# Patient Record
Sex: Male | Born: 1985 | Race: White | Hispanic: No | Marital: Single | Smoking: Never smoker
Health system: Southern US, Community
[De-identification: ages and names within clinical notes are randomized; demographics above are authoritative.]

## PROBLEM LIST (undated history)

## (undated) DIAGNOSIS — E785 Hyperlipidemia, unspecified: Secondary | ICD-10-CM

## (undated) DIAGNOSIS — Z9289 Personal history of other medical treatment: Secondary | ICD-10-CM

## (undated) DIAGNOSIS — S3609XA Other injury of spleen, initial encounter: Secondary | ICD-10-CM

## (undated) DIAGNOSIS — E039 Hypothyroidism, unspecified: Secondary | ICD-10-CM

## (undated) HISTORY — DX: Other injury of spleen, initial encounter: S36.09XA

## (undated) HISTORY — DX: Hypothyroidism, unspecified: E03.9

## (undated) HISTORY — DX: Hyperlipidemia, unspecified: E78.5

## (undated) HISTORY — DX: Personal history of other medical treatment: Z92.89

---
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
## 2005-01-13 ENCOUNTER — Ambulatory Visit: Payer: Self-pay | Admitting: Family Medicine

## 2005-03-24 ENCOUNTER — Ambulatory Visit: Payer: Self-pay | Admitting: Family Medicine

## 2005-06-22 ENCOUNTER — Ambulatory Visit: Payer: Self-pay | Admitting: Family Medicine

## 2005-06-27 ENCOUNTER — Ambulatory Visit: Payer: Self-pay | Admitting: Family Medicine

## 2005-08-03 ENCOUNTER — Ambulatory Visit: Payer: Self-pay | Admitting: Family Medicine

## 2005-09-04 ENCOUNTER — Ambulatory Visit: Payer: Self-pay | Admitting: Family Medicine

## 2005-09-25 ENCOUNTER — Ambulatory Visit: Payer: Self-pay | Admitting: Family Medicine

## 2005-09-27 ENCOUNTER — Ambulatory Visit: Payer: Self-pay | Admitting: Family Medicine

## 2006-01-10 IMAGING — CT CT ABDOMEN W/ CM
1 of 3 series · 15 of 32 positions shown, 19 images · non-contrast
Comparison: none

REASON FOR EXAM: Ruptured Spleen February 2004  Follow Up Status
COMMENTS:

[Series 4: inspace · axial · 0.66mm/px · z∈[-429,-199]mm · 15 of 324 slices shown, 19 images]
[im 24/324  soft-tissue]
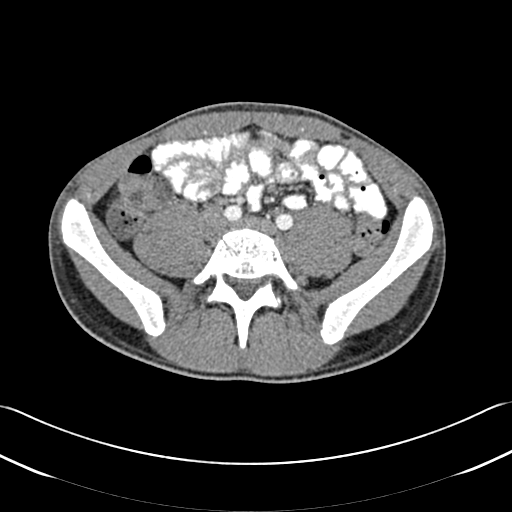
[im 24/324  bone]
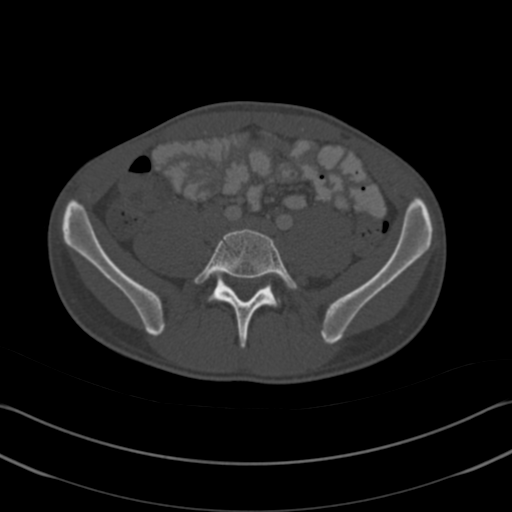
[im 48/324  soft-tissue]
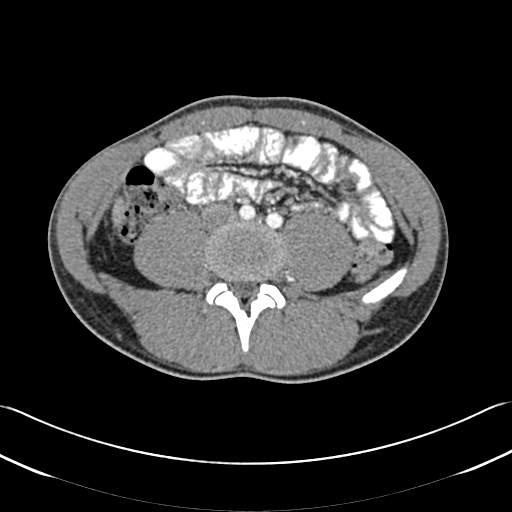
[im 72/324  soft-tissue]
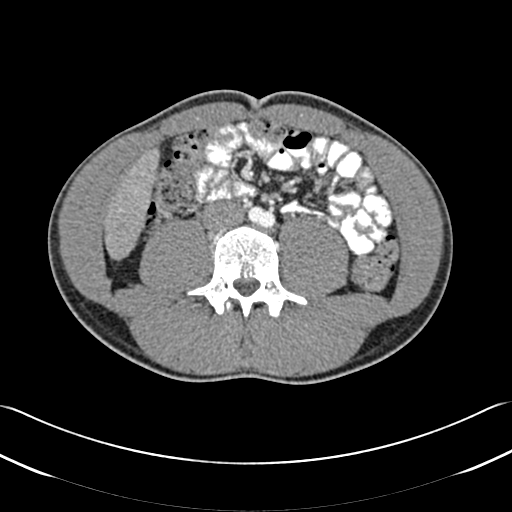
[im 96/324  soft-tissue]
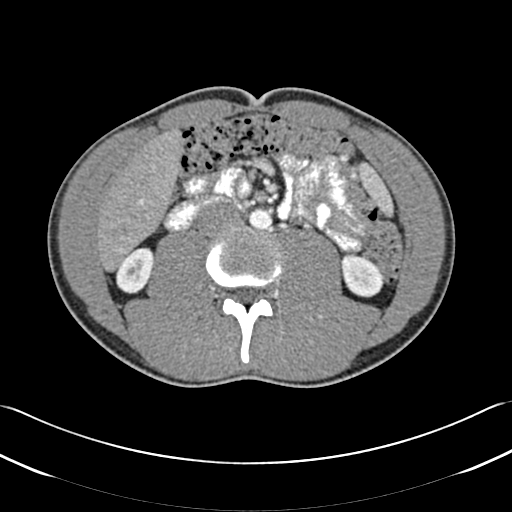
[im 120/324  soft-tissue]
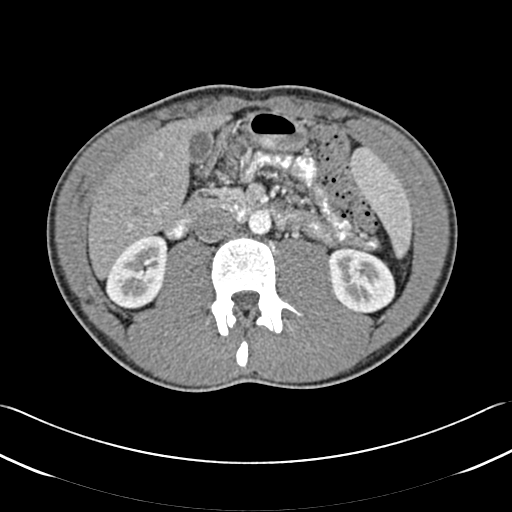
[im 144/324  soft-tissue]
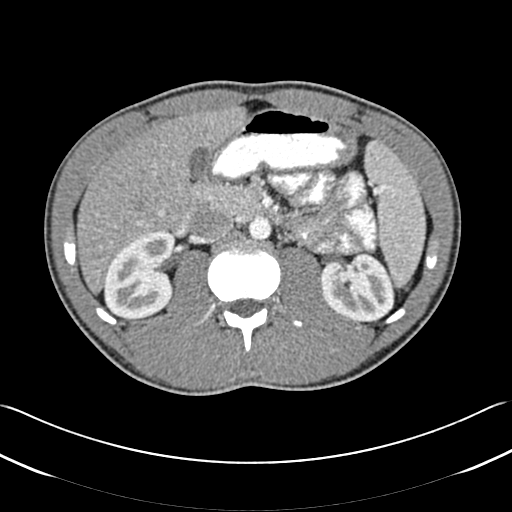
[im 168/324  soft-tissue]
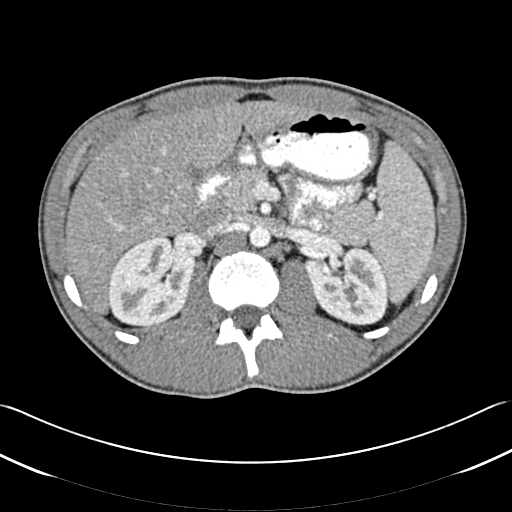
[im 192/324  soft-tissue]
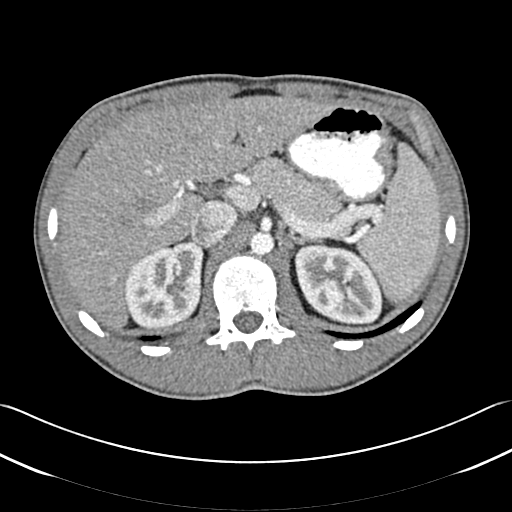
[im 216/324  soft-tissue]
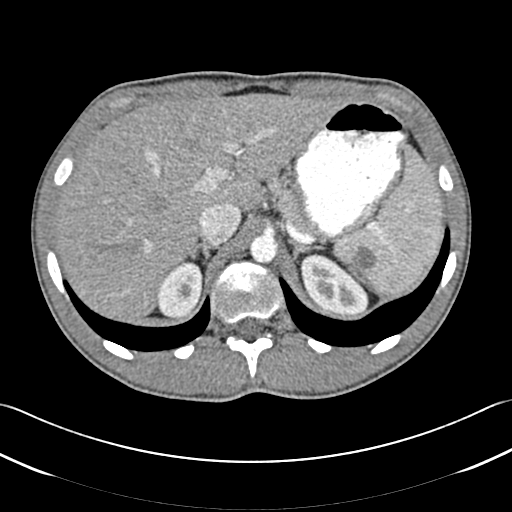
[im 216/324  bone]
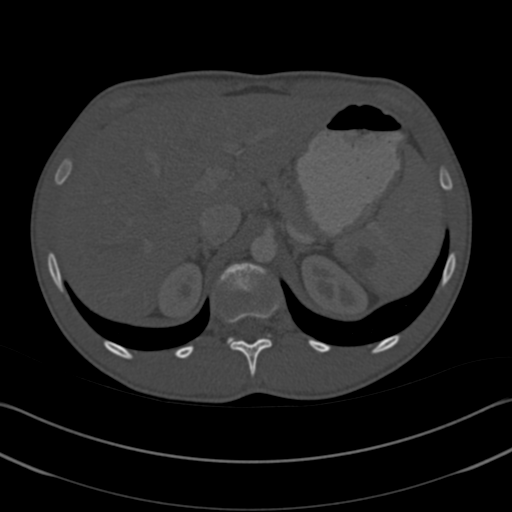
[im 240/324  soft-tissue]
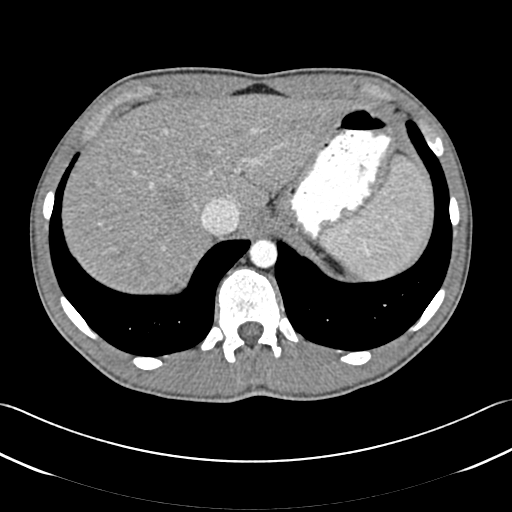
[im 264/324  soft-tissue]
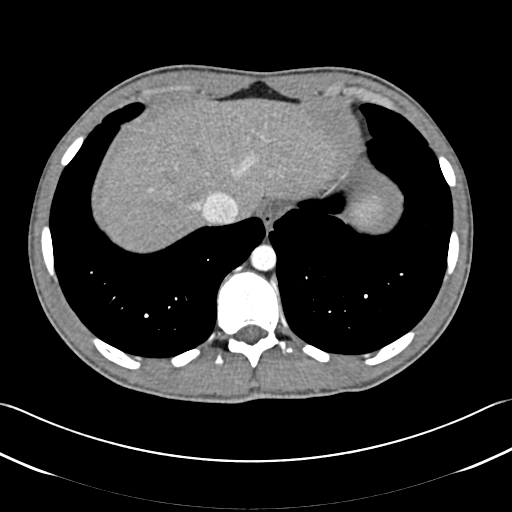
[im 276/324  lung]
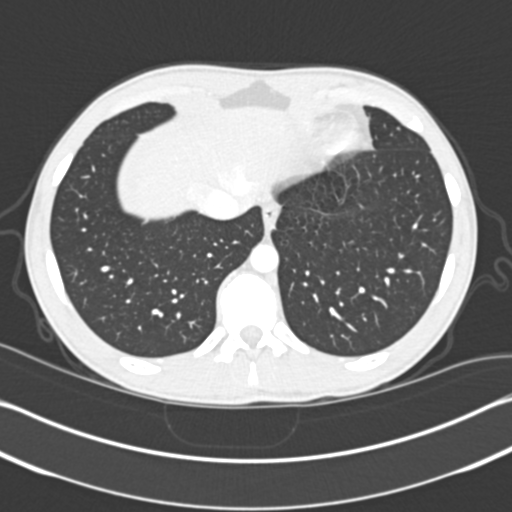
[im 288/324  soft-tissue]
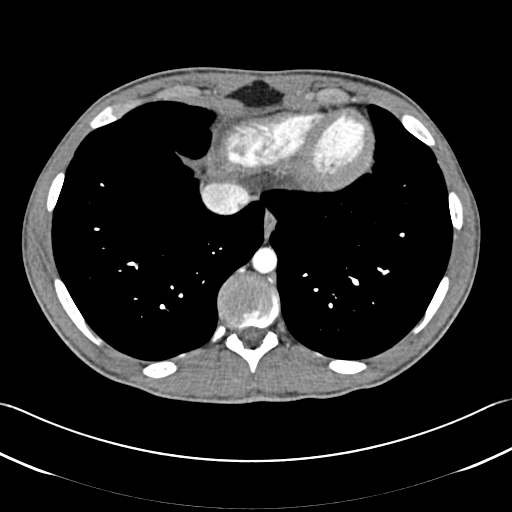
[im 288/324  lung]
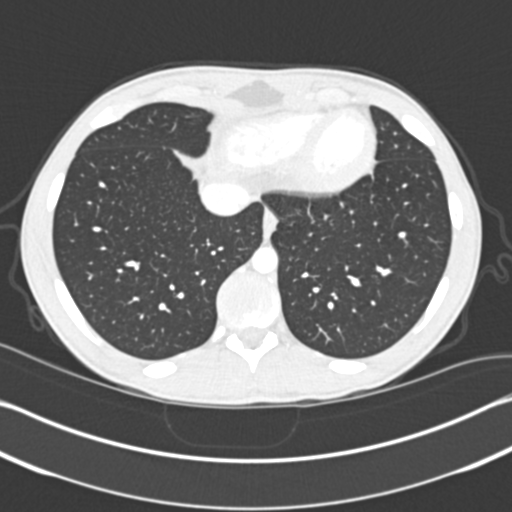
[im 300/324  lung]
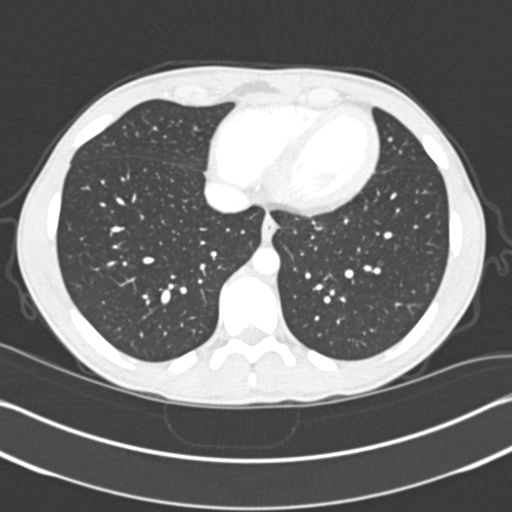
[im 312/324  soft-tissue]
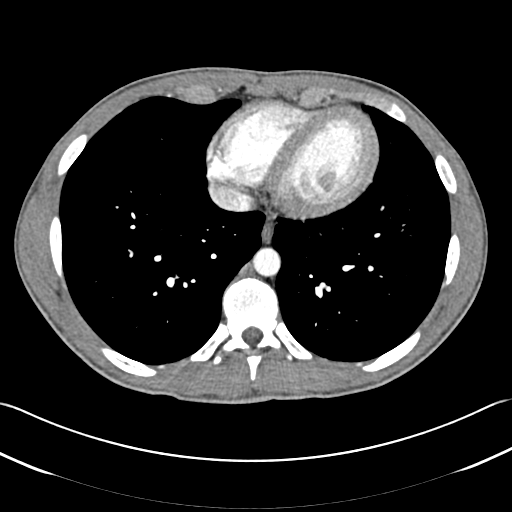
[im 312/324  lung]
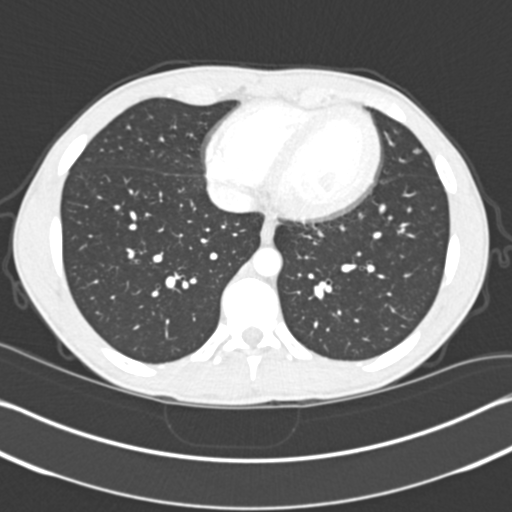

[15 of 32 positions shown; findings below may reference images not displayed]

PROCEDURE:     CT  - CT ABDOMEN STANDARD W  - June 24, 2004 [DATE]

RESULT:        IV and oral contrast enhanced CT scan of the abdomen only
demonstrates a small cleft along the medial aspect of the spleen posteriorly
but the spleen is otherwise normal and enhances well.   There is no free
fluid or free air.  No focal splenic masses are appreciated.  The liver
appears to be normal.  The pancreas is unremarkable.  The kidneys enhance
normally.  The aorta is normal in caliber.  The visualized bowel is
unremarkable.  The IVC is somewhat prominent but within normal limits.  The
gallbladder is seen with no focal radiopaque stones.
IMPRESSION: No significant morphologic abnormality of the spleen.  Minimal fibrosis
superiorly and medially consistent with the patient's history.

## 2006-03-07 ENCOUNTER — Ambulatory Visit: Payer: Self-pay | Admitting: Family Medicine

## 2006-03-12 ENCOUNTER — Ambulatory Visit: Payer: Self-pay | Admitting: Gastroenterology

## 2006-11-08 ENCOUNTER — Encounter: Payer: Self-pay | Admitting: Family Medicine

## 2006-11-08 DIAGNOSIS — E039 Hypothyroidism, unspecified: Secondary | ICD-10-CM | POA: Insufficient documentation

## 2006-11-08 DIAGNOSIS — E782 Mixed hyperlipidemia: Secondary | ICD-10-CM | POA: Insufficient documentation

## 2006-11-08 DIAGNOSIS — A048 Other specified bacterial intestinal infections: Secondary | ICD-10-CM | POA: Insufficient documentation

## 2006-11-08 DIAGNOSIS — E785 Hyperlipidemia, unspecified: Secondary | ICD-10-CM | POA: Insufficient documentation

## 2006-11-14 ENCOUNTER — Ambulatory Visit: Payer: Self-pay | Admitting: Family Medicine

## 2006-11-14 LAB — CONVERTED CEMR LAB
ALT: 21 units/L (ref 0–53)
AST: 17 units/L (ref 0–37)
BUN: 11 mg/dL (ref 6–23)
CO2: 33 meq/L — ABNORMAL HIGH (ref 19–32)
Calcium: 9.8 mg/dL (ref 8.4–10.5)
Chloride: 107 meq/L (ref 96–112)
Creatinine, Ser: 0.8 mg/dL (ref 0.4–1.5)
Free T4: 1.1 ng/dL (ref 0.6–1.6)
GFR calc Af Amer: 158 mL/min
GFR calc non Af Amer: 131 mL/min
Glucose, Bld: 69 mg/dL — ABNORMAL LOW (ref 70–99)
Potassium: 4.7 meq/L (ref 3.5–5.1)
Sodium: 144 meq/L (ref 135–145)
TSH: 4.02 microintl units/mL (ref 0.35–5.50)

## 2007-06-03 ENCOUNTER — Telehealth (INDEPENDENT_AMBULATORY_CARE_PROVIDER_SITE_OTHER): Payer: Self-pay | Admitting: *Deleted

## 2007-09-30 ENCOUNTER — Ambulatory Visit: Payer: Self-pay | Admitting: Family Medicine

## 2007-09-30 LAB — CONVERTED CEMR LAB
Free T4: 1.2 ng/dL (ref 0.6–1.6)
T3, Free: 3.4 pg/mL (ref 2.3–4.2)
TSH: 4.26 microintl units/mL (ref 0.35–5.50)

## 2007-11-07 ENCOUNTER — Ambulatory Visit: Payer: Self-pay | Admitting: Family Medicine

## 2007-11-28 ENCOUNTER — Ambulatory Visit: Payer: Self-pay | Admitting: Family Medicine

## 2007-11-28 DIAGNOSIS — R5383 Other fatigue: Secondary | ICD-10-CM

## 2007-11-28 DIAGNOSIS — R5381 Other malaise: Secondary | ICD-10-CM

## 2007-11-28 LAB — CONVERTED CEMR LAB
TSH: 2.2 microintl units/mL (ref 0.35–5.50)
Testosterone: 500.6 ng/dL (ref 350.00–890)

## 2009-05-03 ENCOUNTER — Telehealth (INDEPENDENT_AMBULATORY_CARE_PROVIDER_SITE_OTHER): Payer: Self-pay | Admitting: *Deleted

## 2009-05-26 ENCOUNTER — Ambulatory Visit: Payer: Self-pay | Admitting: Family Medicine

## 2009-06-01 LAB — CONVERTED CEMR LAB
Free T4: 1.1 ng/dL (ref 0.6–1.6)
T3, Free: 3.5 pg/mL (ref 2.3–4.2)
TSH: 3.62 microintl units/mL (ref 0.35–5.50)

## 2009-06-09 ENCOUNTER — Ambulatory Visit: Payer: Self-pay | Admitting: Family Medicine

## 2009-06-10 LAB — CONVERTED CEMR LAB
ALT: 18 units/L (ref 0–53)
AST: 17 units/L (ref 0–37)
Albumin: 4.8 g/dL (ref 3.5–5.2)
Alkaline Phosphatase: 41 units/L (ref 39–117)
BUN: 14 mg/dL (ref 6–23)
Basophils Absolute: 0 10*3/uL (ref 0.0–0.1)
Basophils Relative: 0.5 % (ref 0.0–3.0)
Bilirubin, Direct: 0.2 mg/dL (ref 0.0–0.3)
CO2: 29 meq/L (ref 19–32)
Calcium: 9.7 mg/dL (ref 8.4–10.5)
Chloride: 107 meq/L (ref 96–112)
Creatinine, Ser: 0.8 mg/dL (ref 0.4–1.5)
Eosinophils Absolute: 0.1 10*3/uL (ref 0.0–0.7)
Eosinophils Relative: 2.6 % (ref 0.0–5.0)
GFR calc non Af Amer: 126.84 mL/min (ref >60)
Glucose, Bld: 72 mg/dL (ref 70–99)
HCT: 48 % (ref 39.0–52.0)
Hemoglobin: 16.1 g/dL (ref 13.0–17.0)
Hgb A1c MFr Bld: 5 % (ref 4.6–6.5)
Lymphocytes Relative: 27.8 % (ref 12.0–46.0)
Lymphs Abs: 1.3 10*3/uL (ref 0.7–4.0)
MCHC: 33.5 g/dL (ref 30.0–36.0)
MCV: 94 fL (ref 78.0–100.0)
Monocytes Absolute: 0.2 10*3/uL (ref 0.1–1.0)
Monocytes Relative: 5 % (ref 3.0–12.0)
Neutro Abs: 3.1 10*3/uL (ref 1.4–7.7)
Neutrophils Relative %: 64.1 % (ref 43.0–77.0)
Platelets: 197 10*3/uL (ref 150.0–400.0)
Potassium: 4.6 meq/L (ref 3.5–5.1)
RBC: 5.1 M/uL (ref 4.22–5.81)
RDW: 11.2 % — ABNORMAL LOW (ref 11.5–14.6)
Sodium: 143 meq/L (ref 135–145)
Total Bilirubin: 1.2 mg/dL (ref 0.3–1.2)
Total Protein: 7.2 g/dL (ref 6.0–8.3)
WBC: 4.7 10*3/uL (ref 4.5–10.5)

## 2009-12-20 ENCOUNTER — Encounter (INDEPENDENT_AMBULATORY_CARE_PROVIDER_SITE_OTHER): Payer: Self-pay | Admitting: *Deleted

## 2010-06-14 NOTE — Assessment & Plan Note (Signed)
Summary: Follow-up/[facility] from 05/31/09- RL   Vital Signs:  Patient profile:   25 year old male Height:      66 inches Weight:      145.75 pounds BMI:     23.61 Temp:     97.7 degrees F oral Pulse rate:   76 / minute Pulse rhythm:   regular BP sitting:   104 / 70  (left arm) Cuff size:   regular  Vitals Entered By: Sydell Axon LPN (June 09, 2009 11:27 AM) CC: Follow-up on thyroid   History of Present Illness: Pt here for thyrpoid disease followup. He hhasn't been here for awhile as he was off his parents insurance. He is doing well except he had some issues with exercising in the past. He had sxs of early fatigue and felt washed out and tired for two hrs... He has had psoriasis of the left knee area. He has had hypothyroidism for a few years now and has done lots of research. He wonders if he could have had Hashimoto's which he knows is an autoimmune phenomenon and may have other autoimmune reactions going on. He basically feels well except for the fatigue with extreme exertion...which he hasn't tested lately.   Problems Prior to Update: 1)  Other Malaise and Fatigue  (ICD-780.79) 2)  Helicobacter Pylori Gastritis  (ICD-041.86) 3)  Hypothyroidism  (ICD-244.9) 4)  Hyperlipidemia  (ICD-272.4)  Medications Prior to Update: 1)  Synthroid 88 Mcg Tabs (Levothyroxine Sodium) .Marland Kitchen.. 1 Tablet By Mouth Once A Day  Allergies: No Known Drug Allergies  Physical Exam  General:  Well-developed,well-nourished,in no acute distress; alert,appropriate and cooperative throughout examination, very intense. Head:  Normocephalic and atraumatic without obvious abnormalities. No apparent alopecia or balding. Eyes:  Mild proptosis and intensity. Ears:  External ear exam shows no significant lesions or deformities.  Otoscopic examination reveals clear canals, tympanic membranes are intact bilaterally without bulging, retraction, inflammation or discharge. Hearing is grossly normal bilaterally. Nose:   External nasal examination shows no deformity or inflammation. Nasal mucosa are pink and moist without lesions or exudates. Mouth:  Oral mucosa and oropharynx without lesions or exudates.  Teeth in good repair. Neck:  No deformities, masses, or tenderness noted. Lungs:  Normal respiratory effort, chest expands symmetrically. Lungs are clear to auscultation, no crackles or wheezes. Heart:  Normal rate and regular rhythm. S1 and S2 normal without gallop, murmur, click, rub or other extra sounds.   Impression & Recommendations:  Problem # 1:  OTHER MALAISE AND FATIGUE (ICD-780.79) Assessment Unchanged Will order further blood screens. Cont curr thyroid replacement. Orders: Venipuncture (14782) TLB-CBC Platelet - w/Differential (85025-CBCD) TLB-Glucose, QUANT (82947-GLU) TLB-A1C / Hgb A1C (Glycohemoglobin) (83036-A1C) TLB-Hepatic/Liver Function Pnl (80076-HEPATIC) TLB-BMP (Basic Metabolic Panel-BMET) (80048-METABOL)  Problem # 2:  HYPOTHYROIDISM (ICD-244.9) Assessment: Improved Euthyroi9d at curr dose. His updated medication list for this problem includes:    Synthroid 88 Mcg Tabs (Levothyroxine sodium) .Marland Kitchen... 1 tablet by mouth once a day  Labs Reviewed: TSH: 3.62 (05/26/2009)     Complete Medication List: 1)  Synthroid 88 Mcg Tabs (Levothyroxine sodium) .Marland Kitchen.. 1 tablet by mouth once a day  Patient Instructions: 1)  If labs nml, send to Cardiology.  2)  25 mins spent with pt.  Current Allergies (reviewed today): No known allergies

## 2010-06-14 NOTE — Letter (Signed)
Summary: Nadara Eaton letter  [place] at [facility]  [address] [place], [place] [address]   Phone: [phone]  Fax: [phone]       12/20/2009 MRN: [id number]  BENN TARVER [address] [place], [place]  [address]  Dear Mr. Peri [facility] [place], and [facility] announce the retirement of Arta Silence, M.D., from full-time practice at the [facility] office effective November 11, 2009 and his plans of returning part-time.  It is important to Dr. Hetty Ely and to our practice that you understand that [facility] has seven physicians in our office for your health care needs.  We will continue to offer the same exceptional care that you have today.    Dr. Hetty Ely has spoken to many of you about his plans for retirement and returning part-time in the fall.   We will continue to work with you through the transition to schedule appointments for you in the office and meet the high standards that [place] is committed to.   Again, it is with great pleasure that we share the news that Dr. Hetty Ely will return to [facility] at [facility] in October of 2011 with a reduced schedule.    If you have any questions, or would like to request an appointment with one of our physicians, please call us at [phone] and press the option for Scheduling an appointment.  We take pleasure in providing you with excellent patient care and look forward to seeing you at your next office visit.  Our [facility] Physicians are:  Tillman Abide, M.D. Laurita Quint, M.D. Roxy Manns, M.D. Kerby Nora, M.D. Hannah Beat, M.D. Ruthe Mannan, M.D. We proudly welcomed Raechel Ache, M.D. and Eustaquio Boyden, M.D. to the practice in July/August 2011.  Sincerely,   [facility]

## 2010-09-30 NOTE — Op Note (Signed)
   NAME:  Ian Sawyer, Ian Sawyer                          ACCOUNT NO.:  [id number]   MEDICAL RECORD NO.:  [id number]                   PATIENT TYPE:  AMB   LOCATION:  ENDO                                 FACILITY:  [facility]   PHYSICIAN:  Georgiana Spinner, M.D.                 DATE OF BIRTH:  01-14-1986   DATE OF PROCEDURE:  03/02/2003  DATE OF DISCHARGE:                                 OPERATIVE REPORT   ADDENDUM:  [place] copy of report to Dr. Dixie Dials in [place].                                               Georgiana Spinner, M.D.    GMO/MEDQ  D:  03/02/2003  T:  03/02/2003  Job:  [id number]

## 2010-09-30 NOTE — Letter (Signed)
March 11, 2006    Rob Bunting, M.D.  [address]  [place], [place] [address]   RE:  JAMAREE, HOSIER  MRN:  [id number]  /  DOB:  20-Mar-1986   Dear Dr. Christella Hartigan:   This is to introduce Erma Joubert, a 25 year old white male who has had H.  pylori disease in the past and since that time has had heart burn type  symptoms.  He has recently been on Aciphex with good control but has  required taking it once a day and I cannot taper him off of it. He had  endoscopy in 2004, I do not have the records of which, but I was told that  it was normal. At 25 years of age I would rather this patient not be on  Aciphex but if he does not take it he starts getting his symptoms back  within a few days.  I look to you for further evaluation and possible  treatment of other malady. He also has hypothyroidism and a low HDL.  His  medications include Synthroid 88 mcg daily, Bentyl p.r.n., Prevacid in the  past, now Aciphex 20 mg daily.  He has no known drug allergies.   I appreciate your seeing him and look forward to your evaluation.    Sincerely,     ______________________________  Arta Silence, MD    RNS/MedQ  DD: 03/07/2006  DT: 03/08/2006  Job #: [id number]

## 2010-09-30 NOTE — Assessment & Plan Note (Signed)
[facility]                           GASTROENTEROLOGY OFFICE NOTE   JUPITER, Ian Sawyer                         MRN:          [id number]  DATE:03/12/2006                            DOB:          29-Nov-1985    REFERRING PHYSICIAN:  Arta Silence, MD   NEW GI CONSULTATION:   REASON FOR REFERRAL:  Dr. Hetty Sawyer asked me to evaluate Ian Sawyer  regarding intermittent abdominal discomforts.   HISTORY OF PRESENT ILLNESS:  Ian Sawyer is a very pleasant 25 year old man  who was having intermittent abdominal pains 2 to 3 years ago.  This was  evaluated by Dr. Sabino Sawyer with upper endoscopy, CT scan and I believe lab  tests.  CT scan in October 2004 of the abdomen and pelvis with IV and oral  contrast was normal.  An EGD in October 2004 showed mild nonspecific  gastritis, biopsies from this confirmed nonspecific reactive gastropathy,  chronic gastritis.  No H. pylori was seen.  Of note he had been treated for  H. pylori about 5 to 6 years earlier.  Dr. Virginia Rochester explained to Ian Sawyer that  he probably had some stress related dyspeptic symptoms likely functional  dyspepsia.  He reassured him and also gave him a trial of proton pump  inhibitor.  While taking AcipHex 20 mg once daily, Ian Sawyer reports that  his symptoms are completely irradicated.  He ran out of the prescription and  was off of it for about 2 weeks and noticed during that time his  intermittent abdominal pains were worse.   REVIEW OF SYSTEMS:  Is essentially normal and is available on his nursing  intake sheet.   PAST MEDICAL HISTORY:  Hypothyroid.  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   CURRENT MEDICATIONS:  Synthroid and AcipHex.   ALLERGIES:  No known drug allergies.   SOCIAL HISTORY:  Single, lives in a dorm, works as a Designer, television/film set at [employer],  nonsmoker, nondrinker.   FAMILY HISTORY:  No colon cancer, colon polyps in family.   PHYSICAL EXAMINATION:  Height is 5 feet 7  inches.  Weight 145 pounds.  Blood  pressure 116/60.  CONSTITUTIONAL:  Generally well-appearing.  NEUROLOGICAL:  Alert and oriented x3.  EYES:  Extraocular movements intact.  Mouth oropharynx moist, no lesions.  NECK:  Supple.  No lymphadenopathy.  CARDIOVASCULAR:  Heart regular rate and rhythm.  LUNGS:  Clear to auscultation bilaterally.  ABDOMEN:  Soft, nontender.  Nondistended, normal bowel sounds.  EXTREMITIES:  No extremity edema.  SKIN:  No rashes or lesions on visible extremities.   ASSESSMENT AND PLAN:  61. A 25 year old man with possibly acid related dyspeptic symptoms.  2. Ian Sawyer had upper endoscopy and CT scan in 2004 and given the fact      that his symptoms are very well controlled on once daily proton pump      inhibitor I see no reason to repeat the workup.  I recommended that he      continue to take his proton pump inhibitor on a daily basis and      explained  to him that 20 to 30 minutes prior to a decent breakfast meal      is probably the best way he could time this medicine.  I will get a      basic set of lab tests and if there is anything dramatically abnormal      then we will want to reconsider repeating some of this workup but his      symptoms are very well controlled on proton pump inhibitor once daily      and I doubt that there will be significant abnormalities on his lab      tests.    ______________________________  Rachael Fee, MD    DPJ/MedQ  DD: 03/12/2006  DT: 03/12/2006  Job #: [id number]   cc:   Arta Silence, MD

## 2010-09-30 NOTE — Op Note (Signed)
   NAME:  Ian Sawyer, THWAITES                          ACCOUNT NO.:  [id number]   MEDICAL RECORD NO.:  [id number]                   PATIENT TYPE:  AMB   LOCATION:  ENDO                                 FACILITY[facility]  [facility]   PHYSICIAN:  Georgiana Spinner, M.D.                 DATE OF BIRTH:  04-Nov-1985   DATE OF PROCEDURE:  DATE OF DISCHARGE:                                 OPERATIVE REPORT   PROCEDURE:  Upper endoscopy with biopsy.   ENDOSCOPIST:  Georgiana Spinner, M.D.   INDICATIONS FOR PROCEDURE:  Abdominal pain.   ANESTHESIA:  Demerol 50 mg, Versed 8 mg.   DESCRIPTION OF PROCEDURE:  With the patient  mildly sedated in the left  lateral decubitus position, the Olympus videoscopic endoscope was inserted  into the mouth and passed under direct vision through the esophagus, which  appeared normal, into the stomach.  The fundus, body, antrum, duodenal bulb  and the second portion of the duodenum were visualized.  From this point the  endoscope was slowly withdrawn, taking circumferential views of the duodenal  mucosa.  The endoscope was then pulled back and the stomach placed on  retroflexion to view the stomach from below.  The endoscope was then  straightened and pulled back, taking circumferential of the gastric and  esophageal mucosa, stopping to biopsy the gastric mucosa where in places had  a geographic musocal pattern.  The patient's vital signs and pulse oximeter  remained stable.  The patient tolerated the procedure well without apparent complications.   FINDINGS:  Nonspecific changes of gastric mucosa.   DISPOSITION:  Await biopsy report.  The patient will call me for results, or  his mother or father will call me for the results.  He will follow up with  me as an outpatient.                                                Georgiana Spinner, M.D.    GMO/MEDQ  D:  03/02/2003  T:  03/02/2003  Job:  [id number]

## 2010-11-24 ENCOUNTER — Telehealth: Payer: Self-pay | Admitting: *Deleted

## 2010-11-24 DIAGNOSIS — E039 Hypothyroidism, unspecified: Secondary | ICD-10-CM

## 2010-11-24 NOTE — Telephone Encounter (Signed)
Please have pt make appt to be seen to discuss labs a few days after they are drawn. He needs to be seen yearly.

## 2010-11-24 NOTE — Telephone Encounter (Signed)
Patient is asking if he can come in for thyroid labs. Please advise.

## 2010-11-25 NOTE — Telephone Encounter (Signed)
CPX scheduled with labs prior.

## 2010-12-01 ENCOUNTER — Encounter: Payer: Self-pay | Admitting: Family Medicine

## 2010-12-06 ENCOUNTER — Other Ambulatory Visit (INDEPENDENT_AMBULATORY_CARE_PROVIDER_SITE_OTHER): Payer: 59 | Admitting: Family Medicine

## 2010-12-06 DIAGNOSIS — E039 Hypothyroidism, unspecified: Secondary | ICD-10-CM

## 2010-12-06 LAB — BASIC METABOLIC PANEL
BUN: 13 mg/dL (ref 6–23)
CO2: 31 mEq/L (ref 19–32)
Calcium: 9.3 mg/dL (ref 8.4–10.5)
Chloride: 104 mEq/L (ref 96–112)
Creatinine, Ser: 0.8 mg/dL (ref 0.4–1.5)

## 2010-12-06 LAB — T4, FREE: Free T4: 1.11 ng/dL (ref 0.60–1.60)

## 2010-12-22 ENCOUNTER — Encounter: Payer: Self-pay | Admitting: Family Medicine

## 2010-12-22 ENCOUNTER — Ambulatory Visit (INDEPENDENT_AMBULATORY_CARE_PROVIDER_SITE_OTHER): Payer: 59 | Admitting: Family Medicine

## 2010-12-22 DIAGNOSIS — R5381 Other malaise: Secondary | ICD-10-CM

## 2010-12-22 DIAGNOSIS — E785 Hyperlipidemia, unspecified: Secondary | ICD-10-CM

## 2010-12-22 DIAGNOSIS — A048 Other specified bacterial intestinal infections: Secondary | ICD-10-CM

## 2010-12-22 DIAGNOSIS — E039 Hypothyroidism, unspecified: Secondary | ICD-10-CM

## 2010-12-22 NOTE — Assessment & Plan Note (Signed)
Euthyroid on current dose. Continue.

## 2010-12-22 NOTE — Assessment & Plan Note (Signed)
Resolved

## 2010-12-22 NOTE — Assessment & Plan Note (Signed)
This is resolved in this intense very aware young man. Discussed health maintenance.

## 2010-12-22 NOTE — Assessment & Plan Note (Signed)
Not checked today as his nos were last pretty normal.

## 2010-12-22 NOTE — Progress Notes (Signed)
  Subjective:    Patient ID: Ian Sawyer, male    DOB: 08-07-1985, 25 y.o.   MRN: [id number]  HPI Pt here for Comp Exam, hasn't been seen in quite a while as he is very healthy and young! He has had no complaints and has done well with hypothyroidism. He has seen a urologist for dribbling and clear ejaculation. He had a cystoscopy. He also was given and alpha blocker. He had some increased libido on this medication.     Review of Systems  Constitutional: Negative for fever, chills, diaphoresis, appetite change, fatigue and unexpected weight change.  HENT: Negative for hearing loss, ear pain, tinnitus and ear discharge.   Eyes: Negative for pain, discharge and visual disturbance.  Respiratory: Negative for cough, shortness of breath and wheezing.   Cardiovascular: Negative for chest pain and palpitations.       No SOB w/ exertion  Gastrointestinal: Negative for nausea, vomiting, abdominal pain, diarrhea, constipation and blood in stool.       No heartburn or swallowing problems.  Genitourinary: Negative for dysuria, frequency and difficulty urinating.       No nocturia  Musculoskeletal: Negative for myalgias, back pain and arthralgias.  Skin: Negative for rash.       No itching or dryness.  Neurological: Negative for tremors and numbness.       No tingling or balance problems.  Hematological: Negative for adenopathy. Does not bruise/bleed easily.  Psychiatric/Behavioral: Negative for dysphoric mood and agitation.       Objective:   Physical Exam  Constitutional: He is oriented to person, place, and time. He appears well-developed and well-nourished. No distress.  HENT:  Head: Normocephalic and atraumatic.  Right Ear: External ear normal.  Left Ear: External ear normal.  Nose: Nose normal.  Mouth/Throat: Oropharynx is clear and moist.  Eyes: Conjunctivae and EOM are normal. Pupils are equal, round, and reactive to light. Right eye exhibits no discharge. Left eye exhibits no  discharge. No scleral icterus.  Neck: Normal range of motion. Neck supple. No thyromegaly present.  Cardiovascular: Normal rate, regular rhythm, normal heart sounds and intact distal pulses.   No murmur heard. Pulmonary/Chest: Effort normal and breath sounds normal. No respiratory distress. He has no wheezes.  Abdominal: Soft. Bowel sounds are normal. He exhibits no distension and no mass. There is no tenderness. There is no rebound and no guarding.  Genitourinary: Penis normal.       No hernias felt.  Musculoskeletal: Normal range of motion. He exhibits no edema.  Lymphadenopathy:    He has no cervical adenopathy.  Neurological: He is alert and oriented to person, place, and time. Coordination normal.  Skin: Skin is warm and dry. No rash noted. He is not diaphoretic.  Psychiatric: He has a normal mood and affect. His behavior is normal. Judgment and thought content normal.          Assessment & Plan:  HMPE

## 2011-01-06 ENCOUNTER — Telehealth: Payer: Self-pay | Admitting: Family Medicine

## 2011-01-25 ENCOUNTER — Ambulatory Visit (INDEPENDENT_AMBULATORY_CARE_PROVIDER_SITE_OTHER): Payer: 59 | Admitting: Family Medicine

## 2011-01-25 ENCOUNTER — Encounter: Payer: Self-pay | Admitting: Family Medicine

## 2011-01-25 VITALS — BP 100/66 | HR 60 | Temp 97.0°F | Ht 66.0 in | Wt 131.2 lb

## 2011-01-25 DIAGNOSIS — E039 Hypothyroidism, unspecified: Secondary | ICD-10-CM

## 2011-01-25 DIAGNOSIS — R1012 Left upper quadrant pain: Secondary | ICD-10-CM

## 2011-01-25 MED ORDER — LEVOTHYROXINE SODIUM 88 MCG PO TABS: 88.0000 ug | ORAL_TABLET | Freq: Every day | ORAL | Status: DC

## 2011-01-25 NOTE — Patient Instructions (Signed)
Refer to endocrinology

## 2011-01-25 NOTE — Assessment & Plan Note (Addendum)
Pt asked about checking T3 today. After discussing at length, do not think merely checking T3 level would reassurte the pt one way or the other. He is 90% back to nml with current thyroid replacement but wonders if more could be going on. I do not think a simple T3 level wil help him as if it is nml, he is still "90%." If it is abnormal, I think he would want more investigation. Either way, I think he should be sent for further eval to Endocrinology. Referral made. Script given for medication for three months and 3 refills for insurance purposes.

## 2011-01-25 NOTE — Progress Notes (Signed)
  Subjective:    Patient ID: Ian Sawyer, male    DOB: [birth year], 25 y.o.   MRN: [id number]  HPI Pt here as acute appt for having sustained mild trauma to his left upper abdomen approximately two weeks ago in class while sitting in a school desk with a retractable chair that swung back and hit him in the abdomen. 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 He has had no sense of swelling in the abdomen. His discomfort is characterized as radiating around the side, from back to front in the LUQ. He has not had bruising of which he is aware. He is sore with movement in this same radiating distribution around the upper left flank. He has no sense of swelling. Urination and bowel function are unchanged. He has not seen blood in his urine. He feels reasonably nml except for discomfort with motion.    Review of Systems  Constitutional: Negative for fever, chills, diaphoresis, activity change, appetite change and fatigue.  HENT: Negative for hearing loss, ear pain, congestion, sore throat, rhinorrhea, neck pain, neck stiffness, postnasal drip, sinus pressure, tinnitus and ear discharge.   Eyes: Negative for pain, discharge and visual disturbance.  Respiratory: Negative for cough, shortness of breath and wheezing.   Cardiovascular: Negative for chest pain and palpitations.       No SOB w/ exertion  Gastrointestinal:       No heartburn or swallowing problems.  Genitourinary:       No nocturia  Skin:       No itching or dryness.  Neurological:       No tingling or balance problems.  All other systems reviewed and are negative.  Noncontributory except as above.       Objective:   Physical Exam  Constitutional: He appears well-developed and well-nourished. No distress.  HENT:  Head: Normocephalic and atraumatic.  Right Ear: External ear normal.  Left Ear: External ear normal.  Nose: Nose normal.    Mouth/Throat: Oropharynx is clear and moist.  Eyes: Conjunctivae and EOM are normal. Pupils are equal, round, and reactive to light. Right eye exhibits no discharge. Left eye exhibits no discharge.  Neck: Normal range of motion. Neck supple.  Cardiovascular: Normal rate and regular rhythm.   Pulmonary/Chest: Effort normal and breath sounds normal. He has no wheezes.  Abdominal: Soft. Bowel sounds are normal. He exhibits no distension and no mass. There is tenderness (to palpation in radiating fashion around the left upper flank generally in the Lat Dorsi distribution, also exquisitely tender over the anterior and lateral aspects of the left twelfth rib. No echymosis or swelling noted.). There is no rebound and no guarding.  Lymphadenopathy:    He has no cervical adenopathy.  Skin: He is not diaphoretic.          Assessment & Plan:

## 2011-01-25 NOTE — Assessment & Plan Note (Signed)
Appears to be MSK tenderness presumably from superficial contusion. Apply heat as often as possible and avoid further irritation while healing. Return if sxs worsen.

## 2011-03-29 ENCOUNTER — Other Ambulatory Visit: Payer: Self-pay | Admitting: Family Medicine

## 2011-03-30 ENCOUNTER — Other Ambulatory Visit: Payer: Self-pay | Admitting: Family Medicine

## 2011-10-19 ENCOUNTER — Telehealth: Payer: Self-pay

## 2011-10-19 NOTE — Telephone Encounter (Signed)
Message left advising patient he would need nurse visit tomorrow between 9:30 and 11:00 in order to receive vaccine this week. Advised to call and schedule same and that these were the only times available. Vaccine form filled out, signed by Dr. Para March and placed in your IN box on your desk.

## 2011-10-19 NOTE — Telephone Encounter (Signed)
Pt request update on tetanus immunizaton for school. Needs to have immunization this week. Dr Reece Agar is out of office.Please advise.

## 2011-10-19 NOTE — Telephone Encounter (Signed)
Please schedule a tdap at RN visit.

## 2011-10-20 ENCOUNTER — Ambulatory Visit (INDEPENDENT_AMBULATORY_CARE_PROVIDER_SITE_OTHER): Payer: 59 | Admitting: *Deleted

## 2011-10-20 DIAGNOSIS — Z23 Encounter for immunization: Secondary | ICD-10-CM

## 2011-12-02 ENCOUNTER — Other Ambulatory Visit: Payer: Self-pay | Admitting: Family Medicine

## 2011-12-02 DIAGNOSIS — E039 Hypothyroidism, unspecified: Secondary | ICD-10-CM

## 2011-12-02 DIAGNOSIS — E785 Hyperlipidemia, unspecified: Secondary | ICD-10-CM

## 2011-12-07 ENCOUNTER — Other Ambulatory Visit (INDEPENDENT_AMBULATORY_CARE_PROVIDER_SITE_OTHER): Payer: 59

## 2011-12-07 DIAGNOSIS — E785 Hyperlipidemia, unspecified: Secondary | ICD-10-CM

## 2011-12-07 DIAGNOSIS — E039 Hypothyroidism, unspecified: Secondary | ICD-10-CM

## 2011-12-07 LAB — LIPID PANEL
Cholesterol: 143 mg/dL (ref 0–200)
HDL: 44.6 mg/dL (ref >39.00)
VLDL: 26.8 mg/dL (ref 0.0–40.0)

## 2011-12-07 LAB — BASIC METABOLIC PANEL
BUN: 11 mg/dL (ref 6–23)
CO2: 28 mEq/L (ref 19–32)
Calcium: 9.2 mg/dL (ref 8.4–10.5)
GFR: 133.87 mL/min (ref >60.00)
Glucose, Bld: 67 mg/dL — ABNORMAL LOW (ref 70–99)
Sodium: 139 mEq/L (ref 135–145)

## 2011-12-25 ENCOUNTER — Encounter: Payer: 59 | Admitting: Family Medicine

## 2012-04-20 ENCOUNTER — Other Ambulatory Visit: Payer: Self-pay | Admitting: Family Medicine

## 2017-08-01 ENCOUNTER — Other Ambulatory Visit: Payer: Self-pay | Admitting: Physician Assistant

## 2017-08-01 ENCOUNTER — Encounter: Payer: Self-pay | Admitting: Physician Assistant

## 2017-08-01 ENCOUNTER — Ambulatory Visit (INDEPENDENT_AMBULATORY_CARE_PROVIDER_SITE_OTHER): Payer: Self-pay | Admitting: Physician Assistant

## 2017-08-01 ENCOUNTER — Encounter (INDEPENDENT_AMBULATORY_CARE_PROVIDER_SITE_OTHER): Payer: Self-pay

## 2017-08-01 VITALS — BP 135/88 | HR 76 | Ht 66.0 in | Wt 161.0 lb

## 2017-08-01 DIAGNOSIS — E039 Hypothyroidism, unspecified: Secondary | ICD-10-CM

## 2017-08-01 DIAGNOSIS — L308 Other specified dermatitis: Secondary | ICD-10-CM

## 2017-08-01 MED ORDER — SYNTHROID 75 MCG PO TABS: 75.0000 ug | ORAL_TABLET | Freq: Every day | ORAL | 1 refills | Status: DC

## 2017-08-01 NOTE — Progress Notes (Signed)
Call pt: TSH is showing hypothyroidism just mildly. Certainly suggest treatment even if not symptomatic. We are still awaiting antibodies testing. Will start you at daily. To take first thing in the morning without food or other medication. Recheck lab only in 4-6 weeks.

## 2017-08-01 NOTE — Progress Notes (Signed)
Subjective:    Patient ID: Ian PianoJosh R Hornik, male    DOB: 05-01-1986, 32 y.o.   MRN: [id number]  HPI  Pt is a 32 yo male who presents to the clinic with mother. Pt has hx of hypothyroidism and hyperlipidemia. He has been on BRAND only synthroid but not taken for the last 4 months. Generic levothyroxine breaks him out. He denies any symptoms. He has plenty of energy. Sleeping well but at weird times. He has some dry, scaly spots on his skin that are itchy. No problems with anxiety or depression.he is not exercising. He does not keep to any particular diet.   .. Active Ambulatory Problems    Diagnosis Date Noted  . HELICOBACTER PYLORI GASTRITIS 11/08/2006  . HYPOTHYROIDISM 11/08/2006  . HYPERLIPIDEMIA 11/08/2006   Resolved Ambulatory Problems    Diagnosis Date Noted  . Other malaise and fatigue 11/28/2007  . LUQ pain 01/25/2011   Past Medical History:  Diagnosis Date  . History of CT scan of abdomen 03/06/2003  . Hyperlipidemia   . Hypothyroidism   . Ruptured spleen 10/04   .Marland Kitchen. Family History  Problem Relation Age of Onset  . Cancer Father        spinal/bone (biological)  . Alcohol abuse Father   . Heart disease Maternal Grandfather        MI    .Marland Kitchen. Social History   Socioeconomic History  . Marital status: Single    Spouse name: Not on file  . Number of children: Not on file  . Years of education: Not on file  . Highest education level: Not on file  Social Needs  . Financial resource strain: Not on file  . Food insecurity - worry: Not on file  . Food insecurity - inability: Not on file  . Transportation needs - medical: Not on file  . Transportation needs - non-medical: Not on file  Occupational History  . Occupation: Dentisttudent    Employer: student    Comment: [employer]  Tobacco Use  . Smoking status: Never Smoker  . Smokeless tobacco: Never Used  Substance and Sexual Activity  . Alcohol use: Yes    Alcohol/week: 1.0 oz    Types: 2 Standard drinks or equivalent per  week    Comment: two drinks every other day  . Drug use: No  . Sexual activity: Not Currently  Other Topics Concern  . Not on file  Social History Narrative   Single, lives with mom and step-dad      Review of Systems  All other systems reviewed and are negative.      Objective:   Physical Exam  Constitutional: He is oriented to person, place, and time. He appears well-developed and well-nourished.  HENT:  Head: Normocephalic and atraumatic.  Neck: Normal range of motion. No thyromegaly present.  Cardiovascular: Normal rate, regular rhythm and normal heart sounds.  Abdominal: Soft. Bowel sounds are normal. There is no tenderness.  Neurological: He is alert and oriented to person, place, and time.  Skin: Rash noted.  Patches of dry scaly skin on legs, bilaterally.   Psychiatric: He has a normal mood and affect. His behavior is normal.          Assessment & Plan:  .Marland Kitchen.Sharia ReeveJosh was seen today for establish care and hypothyroidism.  Diagnoses and all orders for this visit:  Acquired hypothyroidism -     Thyroid Panel With TSH -     Thyroid peroxidase antibody  Other eczema  Will check  labs since patient is asymptomatic. Will restart BRAND ONLY synthroid as needed once we get labs. Then recheck TSH in 4-6 weeks.  Use aquafor for dry spots on skin.   Pt had not fasted will check Lipid level later.

## 2017-08-02 LAB — THYROID PANEL WITH TSH
FREE THYROXINE INDEX: 2.8 (ref 1.4–3.8)
T3 Uptake: 31 % (ref 22–35)
T4 TOTAL: 9 ug/dL (ref 4.9–10.5)
TSH: 6.29 mIU/L — ABNORMAL HIGH (ref 0.40–4.50)

## 2017-08-02 LAB — THYROID PEROXIDASE ANTIBODY: Thyroperoxidase Ab SerPl-aCnc: 1 IU/mL (ref <9)

## 2017-08-03 NOTE — Progress Notes (Signed)
Call pt: Great news no antibodies against thyroid.

## 2017-11-07 ENCOUNTER — Other Ambulatory Visit: Payer: Self-pay | Admitting: Physician Assistant

## 2018-03-22 ENCOUNTER — Other Ambulatory Visit: Payer: Self-pay | Admitting: Physician Assistant

## 2018-04-05 ENCOUNTER — Ambulatory Visit (INDEPENDENT_AMBULATORY_CARE_PROVIDER_SITE_OTHER): Payer: Self-pay | Admitting: Physician Assistant

## 2018-04-05 ENCOUNTER — Encounter: Payer: Self-pay | Admitting: Physician Assistant

## 2018-04-05 VITALS — BP 114/69 | HR 92 | Ht 66.0 in | Wt 156.0 lb

## 2018-04-05 DIAGNOSIS — E039 Hypothyroidism, unspecified: Secondary | ICD-10-CM

## 2018-04-05 DIAGNOSIS — Z131 Encounter for screening for diabetes mellitus: Secondary | ICD-10-CM

## 2018-04-05 DIAGNOSIS — Z13220 Encounter for screening for lipoid disorders: Secondary | ICD-10-CM

## 2018-04-05 NOTE — Progress Notes (Signed)
   Subjective:    Patient ID: Ian PianoJosh R Gueye, male    DOB: 1985-12-04, 32 y.o.   MRN: [id number]  HPI Patient is a 32 year old male who presents to the clinic to follow-up on his hypothyroidism and medication refill.  He is doing great currently.  He has no concerns or complaints.  He denies any dry skin, palpitations, headaches, lack of energy.  .. Active Ambulatory Problems    Diagnosis Date Noted  . HELICOBACTER PYLORI GASTRITIS 11/08/2006  . Hypothyroidism 11/08/2006  . HYPERLIPIDEMIA 11/08/2006   Resolved Ambulatory Problems    Diagnosis Date Noted  . Other malaise and fatigue 11/28/2007  . LUQ pain 01/25/2011   Past Medical History:  Diagnosis Date  . History of CT scan of abdomen 03/06/2003  . Hyperlipidemia   . Ruptured spleen 10/04      Review of Systems  All other systems reviewed and are negative.      Objective:   Physical Exam  Constitutional: He is oriented to person, place, and time. He appears well-developed and well-nourished.  HENT:  Head: Normocephalic and atraumatic.  Neck: No thyromegaly present.  Cardiovascular: Normal rate and regular rhythm.  Pulmonary/Chest: Effort normal and breath sounds normal.  Neurological: He is alert and oriented to person, place, and time.  Psychiatric: He has a normal mood and affect. His behavior is normal.          Assessment & Plan:  Marland Kitchen.Marland Kitchen.Diagnoses and all orders for this visit:  Acquired hypothyroidism -     TSH  Screening for lipid disorders -     Lipid Panel w/reflex Direct LDL  Screening for diabetes mellitus -     COMPLETE METABOLIC PANEL WITH GFR   .Marland Kitchen. [facility] 2/9 04/05/2018 08/01/2017  Decreased Interest 0 0  Down, Depressed, Hopeless 0 0  PHQ - 2 Score 0 0  Altered sleeping 0 -  Tired, decreased energy 0 -  Change in appetite 0 -  Feeling bad or failure about yourself  0 -  Trouble concentrating 0 -  Moving slowly or fidgety/restless 0 -  Suicidal thoughts 0 -  PHQ-9 Score  0 -  Difficult doing work/chores Not difficult at all -   Will check labs. 6 yrs since fasting lipids. Ordered today. Will refill accordingly.

## 2018-04-25 LAB — TSH: TSH: 5.76 mIU/L — ABNORMAL HIGH (ref 0.40–4.50)

## 2018-04-26 MED ORDER — LEVOTHYROXINE SODIUM 88 MCG PO TABS: 88.0000 ug | ORAL_TABLET | Freq: Every day | ORAL | 3 refills | Status: DC

## 2018-04-26 NOTE — Addendum Note (Signed)
Addended by: Monica BectonHEKKEKANDAM, THOMAS J on: 04/26/2018 10:33 AM   Modules accepted: Orders

## 2018-04-26 NOTE — Assessment & Plan Note (Signed)
TSH still elevated, we need to increase levothyroxine to 88 mcg.  Sending this in, recheck in 6 weeks with PCP.

## 2018-05-01 ENCOUNTER — Encounter: Payer: Self-pay | Admitting: Physician Assistant

## 2018-05-01 ENCOUNTER — Other Ambulatory Visit: Payer: Self-pay | Admitting: Physician Assistant

## 2018-05-01 ENCOUNTER — Other Ambulatory Visit: Payer: Self-pay

## 2018-05-01 DIAGNOSIS — E039 Hypothyroidism, unspecified: Secondary | ICD-10-CM

## 2018-05-01 NOTE — Progress Notes (Signed)
Per result note:  "Notes recorded by Monica Bectonhekkekandam, Thomas J, MD on 04/26/2018 at 10:32 AM EST TSH still elevated, we need to increase levothyroxine to 88 mcg. Sending this in, recheck in 6 weeks with PCP."  Lab ordered for 6 weeks

## 2018-05-02 MED ORDER — LEVOTHYROXINE SODIUM 88 MCG PO TABS: 88.0000 ug | ORAL_TABLET | Freq: Every day | ORAL | 3 refills | Status: DC

## 2018-09-24 ENCOUNTER — Other Ambulatory Visit: Payer: Self-pay | Admitting: Physician Assistant

## 2018-09-24 DIAGNOSIS — E039 Hypothyroidism, unspecified: Secondary | ICD-10-CM

## 2018-09-24 NOTE — Telephone Encounter (Signed)
Please call patient and schedule virtual/office visit with [facility] for follow up/lab check prior to refills. Thanks!

## 2018-09-24 NOTE — Telephone Encounter (Signed)
Im working from home today and tried to call pt to schedule an appt with Tandy Gaw for a med f/u but it does not ring or anything on my end when I call, so im routing to another scheduler in office.

## 2018-09-30 ENCOUNTER — Telehealth: Payer: Self-pay | Admitting: Neurology

## 2018-09-30 NOTE — Telephone Encounter (Signed)
Received refill request from [facility] pharmacy for patient's synthroid 88 mcg tablet. Patient is overdue for labs/follow up.   Please call patient and schedule virtual/office visit with [facility] for follow up prior to refills. Thanks!

## 2018-10-03 NOTE — Telephone Encounter (Signed)
I've called this patient multiple times to set up a follow up, and the phone number rings once and goes straight to a message with no voicemail.

## 2018-10-29 ENCOUNTER — Ambulatory Visit (INDEPENDENT_AMBULATORY_CARE_PROVIDER_SITE_OTHER): Payer: Self-pay | Admitting: Physician Assistant

## 2018-10-29 ENCOUNTER — Encounter: Payer: Self-pay | Admitting: Physician Assistant

## 2018-10-29 DIAGNOSIS — E039 Hypothyroidism, unspecified: Secondary | ICD-10-CM

## 2018-10-29 NOTE — Progress Notes (Signed)
Patient ID: Ian Sawyer, male   DOB: [birth year], 33 y.o.   MRN: [id number] .Marland KitchenVirtual Visit via Telephone Note  I connected with Ian Sawyer on 10/29/18 at  3:20 PM EDT by telephone and verified that I am speaking with the correct person using two identifiers.  Location: Patient: home Provider: home   I discussed the limitations, risks, security and privacy concerns of performing an evaluation and management service by telephone and the availability of in person appointments. I also discussed with the patient that there may be a patient responsible charge related to this service. The patient expressed understanding and agreed to proceed.   History of Present Illness: Pt is a 33 yo male with hypothyroidism who calls in for medication refill. He has no concerns. He feels great. No problems. Taking medication daily.  Last TSH was elevated in December 2019.   .. Active Ambulatory Problems    Diagnosis Date Noted  . HELICOBACTER PYLORI GASTRITIS 11/08/2006  . Hypothyroidism 11/08/2006  . HYPERLIPIDEMIA 11/08/2006   Resolved Ambulatory Problems    Diagnosis Date Noted  . Other malaise and fatigue 11/28/2007  . LUQ pain 01/25/2011   Past Medical History:  Diagnosis Date  . History of CT scan of abdomen 03/06/2003  . Hyperlipidemia   . Ruptured spleen 10/04   Reviewed med, allergy, problem list.     Observations/Objective: No acute distress. Normal mood.   .. Today's Vitals   10/29/18 1413  Weight: 163 lb (73.9 kg)  Height: 5\' 6"  (1.676 m)   Body mass index is 26.31 kg/m.   Assessment and Plan: .Marland KitchenZenas was seen today for hypothyroidism.  Diagnoses and all orders for this visit:  Acquired hypothyroidism -     TSH  will send over refills pending lab results.   Pt declined lipid and cmp. Due to no insurance to pay for it.    Follow Up Instructions:    I discussed the assessment and treatment plan with the patient. The patient was provided an opportunity to  ask questions and all were answered. The patient agreed with the plan and demonstrated an understanding of the instructions.   The patient was advised to call back or seek an in-person evaluation if the symptoms worsen or if the condition fails to improve as anticipated.  I provided 4 minutes of non-face-to-face time during this encounter.   Iran Planas, PA-C

## 2018-10-29 NOTE — Progress Notes (Deleted)
Patient feeling great. No symptoms. On synthroid 88 mcg. Overdue for labs.

## 2018-11-01 LAB — TSH: TSH: 2.97 mIU/L (ref 0.40–4.50)

## 2018-11-04 ENCOUNTER — Encounter: Payer: Self-pay | Admitting: Physician Assistant

## 2018-11-04 DIAGNOSIS — E039 Hypothyroidism, unspecified: Secondary | ICD-10-CM

## 2018-11-04 MED ORDER — LEVOTHYROXINE SODIUM 88 MCG PO TABS: 88.0000 ug | ORAL_TABLET | Freq: Every day | ORAL | 3 refills | Status: DC

## 2018-11-04 NOTE — Addendum Note (Signed)
Addended by: Donella Stade on: 11/04/2018 05:40 AM   Modules accepted: Orders

## 2018-11-04 NOTE — Progress Notes (Signed)
Thyroid just right where it needs to be. Refill for 1 year.

## 2018-12-18 NOTE — Telephone Encounter (Signed)
Read message below per Delsa Sale, please close encounter

## 2020-06-04 ENCOUNTER — Encounter: Payer: Self-pay | Admitting: Physician Assistant

## 2020-06-04 DIAGNOSIS — E039 Hypothyroidism, unspecified: Secondary | ICD-10-CM

## 2020-06-04 MED ORDER — LEVOTHYROXINE SODIUM 88 MCG PO TABS: 88.0000 ug | ORAL_TABLET | Freq: Every day | ORAL | 0 refills | Status: DC

## 2020-10-29 ENCOUNTER — Encounter: Payer: Self-pay | Admitting: Physician Assistant

## 2020-10-29 DIAGNOSIS — E039 Hypothyroidism, unspecified: Secondary | ICD-10-CM

## 2020-11-01 MED ORDER — LEVOTHYROXINE SODIUM 88 MCG PO TABS: 88.0000 ug | ORAL_TABLET | Freq: Every day | ORAL | 0 refills | Status: DC

## 2020-11-04 NOTE — Addendum Note (Signed)
Addended by: Chalmers Cater on: 11/04/2020 07:58 AM   Modules accepted: Orders

## 2020-12-20 LAB — TSH: TSH: 4.78 mIU/L — ABNORMAL HIGH (ref 0.40–4.50)

## 2020-12-21 ENCOUNTER — Other Ambulatory Visit: Payer: Self-pay | Admitting: Physician Assistant

## 2020-12-21 ENCOUNTER — Other Ambulatory Visit: Payer: Self-pay | Admitting: Neurology

## 2020-12-21 DIAGNOSIS — E039 Hypothyroidism, unspecified: Secondary | ICD-10-CM

## 2020-12-21 MED ORDER — SYNTHROID 100 MCG PO TABS: 100.0000 ug | ORAL_TABLET | Freq: Every day | ORAL | 0 refills | Status: DC

## 2020-12-21 NOTE — Progress Notes (Signed)
March,   Increased your synthroid dose to daily due to TSH showing HYPO thyroid. Recheck labs in 3 months.

## 2021-04-09 ENCOUNTER — Encounter: Payer: Self-pay | Admitting: Physician Assistant

## 2021-04-13 NOTE — Telephone Encounter (Signed)
Please schedule patient. Find out if he has enough medication to make it until appt. If not I will send that.

## 2021-04-14 NOTE — Telephone Encounter (Signed)
I called patient and left him a voicemail to call us and schedule his appointment - CF

## 2021-04-20 ENCOUNTER — Ambulatory Visit (INDEPENDENT_AMBULATORY_CARE_PROVIDER_SITE_OTHER): Payer: Self-pay | Admitting: Physician Assistant

## 2021-04-20 ENCOUNTER — Encounter: Payer: Self-pay | Admitting: Physician Assistant

## 2021-04-20 ENCOUNTER — Other Ambulatory Visit: Payer: Self-pay

## 2021-04-20 VITALS — BP 120/84 | HR 118 | Ht 66.0 in | Wt 221.0 lb

## 2021-04-20 DIAGNOSIS — Z79899 Other long term (current) drug therapy: Secondary | ICD-10-CM

## 2021-04-20 DIAGNOSIS — R000 Tachycardia, unspecified: Secondary | ICD-10-CM

## 2021-04-20 DIAGNOSIS — Z13220 Encounter for screening for lipoid disorders: Secondary | ICD-10-CM

## 2021-04-20 DIAGNOSIS — Z131 Encounter for screening for diabetes mellitus: Secondary | ICD-10-CM

## 2021-04-20 DIAGNOSIS — E039 Hypothyroidism, unspecified: Secondary | ICD-10-CM

## 2021-04-20 MED ORDER — SYNTHROID 100 MCG PO TABS
100.0000 ug | ORAL_TABLET | Freq: Every day | ORAL | 0 refills | Status: DC
Start: 2021-04-20 — End: 2021-08-29

## 2021-04-20 NOTE — Progress Notes (Signed)
Subjective:    Patient ID: Ian Sawyer, male    DOB: 03-21-1986, 35 y.o.   MRN: [id number]  HPI Pt is a 35 yo male with hypothyroidism who presents to the clinic for medication follow up.   Overall pt is doing well. No CP, palpitations, headaches, vision changes. He does not exercise and not eating healthy. He drinks beer most nights. He does not smoke. He takes synthroid daily but has been out for 1 week.    .. Active Ambulatory Problems    Diagnosis Date Noted   HELICOBACTER PYLORI GASTRITIS 11/08/2006   Hypothyroidism 11/08/2006   HYPERLIPIDEMIA 11/08/2006   Tachycardia 04/20/2021   Resolved Ambulatory Problems    Diagnosis Date Noted   Other malaise and fatigue 11/28/2007   LUQ pain 01/25/2011   Past Medical History:  Diagnosis Date   History of CT scan of abdomen 03/06/2003   Hyperlipidemia    Ruptured spleen 10/04    Review of Systems  All other systems reviewed and are negative.     Objective:   Physical Exam Vitals reviewed.  Constitutional:      Appearance: Normal appearance. He is obese.  HENT:     Head: Normocephalic.  Neck:     Vascular: No carotid bruit.  Cardiovascular:     Rate and Rhythm: Regular rhythm. Tachycardia present.     Pulses: Normal pulses.     Heart sounds: Normal heart sounds.  Pulmonary:     Effort: Pulmonary effort is normal.     Breath sounds: Normal breath sounds.  Musculoskeletal:     Cervical back: No tenderness.  Lymphadenopathy:     Cervical: No cervical adenopathy.  Neurological:     General: No focal deficit present.     Mental Status: He is alert and oriented to person, place, and time.  Psychiatric:        Mood and Affect: Mood normal.     .. Depression screen [facility] 2/9 04/20/2021 04/05/2018 08/01/2017  Decreased Interest 0 0 0  Down, Depressed, Hopeless 0 0 0  PHQ - 2 Score 0 0 0  Altered sleeping 0 0 -  Tired, decreased energy 0 0 -  Change in appetite 0 0 -  Feeling bad or failure about yourself  0 0 -   Trouble concentrating 0 0 -  Moving slowly or fidgety/restless 0 0 -  Suicidal thoughts 0 0 -  PHQ-9 Score 0 0 -  Difficult doing work/chores Not difficult at all Not difficult at all -   .. GAD 7 : Generalized Anxiety Score 04/20/2021  Nervous, Anxious, on Edge 0  Control/stop worrying 0  Worry too much - different things 0  Trouble relaxing 0  Restless 0  Easily annoyed or irritable 0  Afraid - awful might happen 0  Total GAD 7 Score 0  Anxiety Difficulty Not difficult at all         Assessment & Plan:  .Marland KitchenJosh was seen today for annual exam.  Diagnoses and all orders for this visit:  Acquired hypothyroidism -     TSH -     SYNTHROID 100 MCG tablet; Take 1 tablet (100 mcg total) by mouth daily before breakfast. BRAND ONLY.  Medication management -     TSH -     Lipid Panel w/reflex Direct LDL -     COMPLETE METABOLIC PANEL WITH GFR -     CBC with Differential/Platelet  Diabetes mellitus screening -     COMPLETE METABOLIC PANEL  WITH GFR  Lipid screening -     Lipid Panel w/reflex Direct LDL  Tachycardia  Will get labs today.  Pt not fasting so will have to come back.  Been out of synthroid for one week. Sent refill needs labs before runs out.  HR elevated. Asymptomatic. Continue to monitor at home. Resting should be under 100.  Discussed regular exercise and working on weight loss.

## 2021-04-21 ENCOUNTER — Telehealth: Payer: Self-pay | Admitting: Neurology

## 2021-04-21 NOTE — Telephone Encounter (Signed)
Mychart message sent to patient.

## 2021-08-29 ENCOUNTER — Other Ambulatory Visit: Payer: Self-pay | Admitting: Physician Assistant

## 2021-08-29 DIAGNOSIS — E039 Hypothyroidism, unspecified: Secondary | ICD-10-CM

## 2021-08-29 MED ORDER — SYNTHROID 100 MCG PO TABS: 100.0000 ug | ORAL_TABLET | Freq: Every day | ORAL | 0 refills | Status: DC

## 2021-09-13 ENCOUNTER — Ambulatory Visit (INDEPENDENT_AMBULATORY_CARE_PROVIDER_SITE_OTHER): Payer: Self-pay | Admitting: Physician Assistant

## 2021-09-13 ENCOUNTER — Encounter: Payer: Self-pay | Admitting: Physician Assistant

## 2021-09-13 VITALS — BP 156/95 | HR 115 | Ht 66.0 in | Wt 210.0 lb

## 2021-09-13 DIAGNOSIS — E039 Hypothyroidism, unspecified: Secondary | ICD-10-CM

## 2021-09-13 DIAGNOSIS — Z0000 Encounter for general adult medical examination without abnormal findings: Secondary | ICD-10-CM

## 2021-09-13 DIAGNOSIS — R030 Elevated blood-pressure reading, without diagnosis of hypertension: Secondary | ICD-10-CM | POA: Insufficient documentation

## 2021-09-13 DIAGNOSIS — R000 Tachycardia, unspecified: Secondary | ICD-10-CM

## 2021-09-13 NOTE — Progress Notes (Signed)
? ?Complete physical exam ? ?Patient: Ian Sawyer   DOB: 04/29/1986   36 y.o. Male  MRN: [id number] ? ?Subjective:  ?  ?Chief Complaint  ?Patient presents with  ? Annual Exam  ? ? ?Ian Sawyer is a 36 y.o. male who presents today for a complete physical exam. He reports consuming a general diet.  He lifts weights  He generally feels fairly well. He reports sleeping well. He does have additional problems to discuss today.  ? ?He does drink alcohol and caffeine. He feels like "something is not right in his body". Denies any CP. He does feel like his heart is beating fast. He is taking synthroid for his hypothyroidism. No swelling or SOB.  ? ?Most recent fall risk assessment: ? ?  09/13/2021  ?  9:13 AM  ?Fall Risk   ?Falls in the past year? 0  ?Number falls in past yr: 0  ?Injury with Fall? 0  ?Risk for fall due to : No Fall Risks  ?Follow up Falls evaluation completed  ? ?  ?Most recent depression screenings: ? ?  09/13/2021  ?  9:13 AM 04/20/2021  ?  8:14 AM  ?PHQ 2/9 Scores  ?PHQ - 2 Score 0 0  ?PHQ- 9 Score  0  ? ? ?Vision:Within last year and Dental: No current dental problems ? ?Patient Active Problem List  ? Diagnosis Date Noted  ? Elevated blood pressure reading 09/13/2021  ? Tachycardia 04/20/2021  ? HELICOBACTER PYLORI GASTRITIS 11/08/2006  ? Hypothyroidism 11/08/2006  ? HYPERLIPIDEMIA 11/08/2006  ? ?Past Medical History:  ?Diagnosis Date  ? History of CT scan of abdomen 03/06/2003  ? /pelvis-nml  ? Hyperlipidemia   ? Hypothyroidism   ? Ruptured spleen 10/04  ? (playing hockey) bx'd conservatively  ? ?Family History  ?Problem Relation Age of Onset  ? Cancer Father   ?     spinal/bone (biological)  ? Alcohol abuse Father   ? Heart disease Maternal Grandfather   ?     MI  ? ?No Known Allergies ?  ? ?Patient Care Team: ?Nolene Ebbs as PCP - General (Family Medicine)  ? ?Outpatient Medications Prior to Visit  ?Medication Sig  ? SYNTHROID 100 MCG tablet Take 1 tablet (100 mcg total) by mouth daily  before breakfast. BRAND ONLY. APPT FOR REFILLS  ? ?No facility-administered medications prior to visit.  ? ? ?Review of Systems  ?Constitutional: Negative.   ?Eyes: Negative.   ?Respiratory:  Negative for cough and shortness of breath.   ?Cardiovascular:  Positive for palpitations. Negative for leg swelling.  ?Gastrointestinal: Negative.   ?Genitourinary: Negative.   ?Musculoskeletal: Negative.   ? ?.. ? ?  09/13/2021  ?  9:13 AM 04/20/2021  ?  8:14 AM 04/05/2018  ?  1:12 PM 08/01/2017  ?  9:09 AM  ?Depression screen PHQ 2/9  ?Decreased Interest 0 0 0 0  ?Down, Depressed, Hopeless 0 0 0 0  ?PHQ - 2 Score 0 0 0 0  ?Altered sleeping  0 0   ?Tired, decreased energy  0 0   ?Change in appetite  0 0   ?Feeling bad or failure about yourself   0 0   ?Trouble concentrating  0 0   ?Moving slowly or fidgety/restless  0 0   ?Suicidal thoughts  0 0   ?PHQ-9 Score  0 0   ?Difficult doing work/chores  Not difficult at all Not difficult at all   ? ?. ? ?  04/20/2021  ?  8:15 AM  ?GAD 7 : Generalized Anxiety Score  ?Nervous, Anxious, on Edge 0  ?Control/stop worrying 0  ?Worry too much - different things 0  ?Trouble relaxing 0  ?Restless 0  ?Easily annoyed or irritable 0  ?Afraid - awful might happen 0  ?Total GAD 7 Score 0  ?Anxiety Difficulty Not difficult at all  ? ? ? ? ? ?   ?Objective:  ? ?  ?BP (!) 156/95   Pulse (!) 115   Ht 5\' 6"  (1.676 m)   Wt 210 lb (95.3 kg)   SpO2 100%   BMI 33.89 kg/m?  ?BP Readings from Last 3 Encounters:  ?09/13/21 (!) 156/95  ?04/20/21 120/84  ?04/05/18 114/69  ? ?Wt Readings from Last 3 Encounters:  ?09/13/21 210 lb (95.3 kg)  ?04/20/21 221 lb (100.2 kg)  ?10/29/18 163 lb (73.9 kg)  ? ?  ? ?Physical Exam  ? ? ?General Appearance:    Alert, cooperative, no distress, appears stated age  ?Head:    Normocephalic, without obvious abnormality, atraumatic  ?   ?   ?   ?   ?Neck:   Supple, symmetrical, trachea midline, no adenopathy;     ?  thyroid:  No enlargement/tenderness/nodules; no carotid ?  bruit  or JVD  ?Back:     Symmetric, no curvature, ROM normal, no CVA tenderness  ?Lungs:     Clear to auscultation bilaterally, respirations unlabored  ?Chest wall:    No tenderness or deformity  ?Heart:   NSR with tachycardia and rhythm, S1 and S2 normal, no murmur, rub   or gallop  ?Abdomen:     Soft, non-tender, bowel sounds active all four quadrants,  ?  no masses, no organomegaly  ?   ?   ?Extremities:   Extremities normal, atraumatic, no cyanosis or edema  ?Pulses:   2+ and symmetric all extremities  ?Skin:   Skin color, texture, turgor normal, no rashes or lesions  ?Lymph nodes:   Cervical, supraclavicular, and axillary nodes normal  ?Neurologic:   CNII-XII intact. Normal strength, sensation and reflexes    ?  throughout  ? ?  EKG: sinus tachycardia, no acute findings ?Assessment & Plan:  ?  ?Routine Health Maintenance and Physical Exam ? ?Immunization History  ?Administered Date(s) Administered  ? Tdap 10/20/2011  ? ? ?Health Maintenance  ?Topic Date Due  ? COVID-19 Vaccine (1) 09/29/2021 (Originally 07/03/1986)  ? Hepatitis C Screening  04/20/2022 (Originally 01/01/2004)  ? HIV Screening  04/20/2022 (Originally 12/31/2000)  ? TETANUS/TDAP  10/19/2021  ? INFLUENZA VACCINE  12/13/2021  ? HPV VACCINES  Aged Out  ? ?10/01/2023Marland Kitchen was seen today for annual exam. ? ?Diagnoses and all orders for this visit: ? ?Routine physical examination ? ?Tachycardia ? ?Acquired hypothyroidism ? ?Elevated blood pressure reading ? ? ?Discussed health benefits of physical activity, and encouraged him to engage in regular exercise appropriate for his age and condition. ? ?Will get labs today that were ordered in December.  ?PHQ/GAD no concerns ?Concern with BP and HR being elevated ?EKG reassuring ?? If thyroid is not in normal range and causing some of these symptoms ?Discussed low salt diet, avoiding alcohol and caffeine, getting regular exercise ?Recheck BP in 1 month.  ? ?Return in about 4 weeks (around 10/11/2021) for BP and HR. ? ?  ? ?10/13/2021, PA-C ? ? ?

## 2021-09-13 NOTE — Patient Instructions (Addendum)
Sinus Tachycardia ? ?Sinus tachycardia is a kind of fast heartbeat. In sinus tachycardia, the heart beats more than 100 times a minute. Sinus tachycardia starts in a part of the heart called the sinus node. Sinus tachycardia may be harmless, or it may be a sign of a serious condition. ?What are the causes? ?This condition may be caused by: ?Exercise or exertion. ?A fever. ?Pain. ?Loss of body fluids (dehydration). ?Severe bleeding (hemorrhage). ?Anxiety and stress. ?Certain substances, including: ?Alcohol. ?Caffeine. ?Tobacco and nicotine products. ?Cold medicines. ?Illegal drugs. ?Medical conditions including: ?Heart disease. ?An infection. ?An overactive thyroid (hyperthyroidism). ?A lack of red blood cells (anemia). ?What are the signs or symptoms? ?Symptoms of this condition include: ?A feeling that the heart is beating quickly (palpitations). ?Suddenly noticing your heartbeat (cardiac awareness). ?Dizziness. ?Tiredness (fatigue). ?Shortness of breath. ?Chest pain. ?Nausea. ?Fainting. ?How is this diagnosed? ?This condition is diagnosed with: ?A physical exam. ?Other tests, such as: ?Blood tests. ?An electrocardiogram (ECG). This test measures the electrical activity of the heart. ?Ambulatory cardiac monitor. This records your heartbeats for 24 hours or more. ?You may be referred to a heart specialist (cardiologist). ?How is this treated? ?Treatment for this condition depends on the cause or the underlying condition. Treatment may involve: ?Treating the underlying condition. ?Taking new medicines or changing your current medicines as told by your health care provider. ?Making changes to your diet or lifestyle. ?Follow these instructions at home: ?Lifestyle ? ?Do not use any products that contain nicotine or tobacco, such as cigarettes and e-cigarettes. If you need help quitting, ask your health care provider. ?Do not use illegal drugs, such as cocaine. ?Learn relaxation methods to help you when you get stressed  or anxious. These include deep breathing. ?Avoid caffeine or other stimulants. ?Alcohol use ? ?Do not drink alcohol if: ?Your health care provider tells you not to drink. ?You are pregnant, may be pregnant, or are planning to become pregnant. ?If you drink alcohol, limit how much you have: ?0-1 drink a day for women. ?0-2 drinks a day for men. ?Be aware of how much alcohol is in your drink. In the [place] one drink equals one typical bottle of beer (12 oz), one-half glass of wine (5 oz), or one shot of hard liquor (1? oz). ?General instructions ?Drink enough fluids to keep your urine pale yellow. ?Take over-the-counter and prescription medicines only as told by your health care provider. ?Keep all follow-up visits as told by your health care provider. This is important. ?Contact a health care provider if you have: ?A fever. ?Vomiting or diarrhea that does not go away. ?Get help right away if you: ?Have pain in your chest, upper arms, jaw, or neck. ?Become weak or dizzy. ?Feel faint. ?Have palpitations that do not go away. ?Summary ?In sinus tachycardia, the heart beats more than 100 times a minute. ?Sinus tachycardia may be harmless, or it may be a sign of a serious condition. ?Treatment for this condition depends on the cause or the underlying condition. ?Get help right away if you have pain in your chest, upper arms, jaw, or neck. ?This information is not intended to replace advice given to you by your health care provider. Make sure you discuss any questions you have with your health care provider. ?Document Revised: 09/09/2020 Document Reviewed: 09/09/2020 ?Elsevier Patient Education ? 2023 Elsevier Inc. ?Health Maintenance, Male ?Adopting a healthy lifestyle and getting preventive care are important in promoting health and wellness. Ask your health care provider about: ?The right schedule  for you to have regular tests and exams. ?Things you can do on your own to prevent diseases and keep yourself healthy. ?What  should I know about diet, weight, and exercise? ?Eat a healthy diet ? ?Eat a diet that includes plenty of vegetables, fruits, low-fat dairy products, and lean protein. ?Do not eat a lot of foods that are high in solid fats, added sugars, or sodium. ?Maintain a healthy weight ?Body mass index (BMI) is a measurement that can be used to identify possible weight problems. It estimates body fat based on height and weight. Your health care provider can help determine your BMI and help you achieve or maintain a healthy weight. ?Get regular exercise ?Get regular exercise. This is one of the most important things you can do for your health. Most adults should: ?Exercise for at least 150 minutes each week. The exercise should increase your heart rate and make you sweat (moderate-intensity exercise). ?Do strengthening exercises at least twice a week. This is in addition to the moderate-intensity exercise. ?Spend less time sitting. Even light physical activity can be beneficial. ?Watch cholesterol and blood lipids ?Have your blood tested for lipids and cholesterol at 36 years of age, then have this test every 5 years. ?You may need to have your cholesterol levels checked more often if: ?Your lipid or cholesterol levels are high. ?You are older than 36 years of age. ?You are at high risk for heart disease. ?What should I know about cancer screening? ?Many types of cancers can be detected early and may often be prevented. Depending on your health history and family history, you may need to have cancer screening at various ages. This may include screening for: ?Colorectal cancer. ?Prostate cancer. ?Skin cancer. ?Lung cancer. ?What should I know about heart disease, diabetes, and high blood pressure? ?Blood pressure and heart disease ?High blood pressure causes heart disease and increases the risk of stroke. This is more likely to develop in people who have high blood pressure readings or are overweight. ?Talk with your health care  provider about your target blood pressure readings. ?Have your blood pressure checked: ?Every 3-5 years if you are 72-82 years of age. ?Every year if you are 39 years old or older. ?If you are between the ages of 58 and 27 and are a current or former smoker, ask your health care provider if you should have a one-time screening for abdominal aortic aneurysm (AAA). ?Diabetes ?Have regular diabetes screenings. This checks your fasting blood sugar level. Have the screening done: ?Once every three years after age 45 if you are at a normal weight and have a low risk for diabetes. ?More often and at a younger age if you are overweight or have a high risk for diabetes. ?What should I know about preventing infection? ?Hepatitis B ?If you have a higher risk for hepatitis B, you should be screened for this virus. Talk with your health care provider to find out if you are at risk for hepatitis B infection. ?Hepatitis C ?Blood testing is recommended for: ?Everyone born from [birth year] through 1965. ?Anyone with known risk factors for hepatitis C. ?Sexually transmitted infections (STIs) ?You should be screened each year for STIs, including gonorrhea and chlamydia, if: ?You are sexually active and are younger than 36 years of age. ?You are older than 36 years of age and your health care provider tells you that you are at risk for this type of infection. ?Your sexual activity has changed since you were last screened, and  you are at increased risk for chlamydia or gonorrhea. Ask your health care provider if you are at risk. ?Ask your health care provider about whether you are at high risk for HIV. Your health care provider may recommend a prescription medicine to help prevent HIV infection. If you choose to take medicine to prevent HIV, you should first get tested for HIV. You should then be tested every 3 months for as long as you are taking the medicine. ?Follow these instructions at home: ?Alcohol use ?Do not drink alcohol if your  health care provider tells you not to drink. ?If you drink alcohol: ?Limit how much you have to 0-2 drinks a day. ?Know how much alcohol is in your drink. In the [place] one drink equals one 12 oz bottle of

## 2021-09-14 ENCOUNTER — Other Ambulatory Visit: Payer: Self-pay | Admitting: Neurology

## 2021-09-14 ENCOUNTER — Encounter: Payer: Self-pay | Admitting: Physician Assistant

## 2021-09-14 DIAGNOSIS — R748 Abnormal levels of other serum enzymes: Secondary | ICD-10-CM

## 2021-09-14 DIAGNOSIS — D582 Other hemoglobinopathies: Secondary | ICD-10-CM

## 2021-09-14 MED ORDER — SYNTHROID 112 MCG PO TABS: 112.0000 ug | ORAL_TABLET | Freq: Every day | ORAL | 0 refills | Status: DC

## 2021-09-15 NOTE — Addendum Note (Signed)
Addended by: Chalmers Cater on: 09/15/2021 08:27 AM ? ? Modules accepted: Orders ? ?

## 2021-09-16 ENCOUNTER — Other Ambulatory Visit: Payer: Self-pay | Admitting: Neurology

## 2021-09-16 DIAGNOSIS — D582 Other hemoglobinopathies: Secondary | ICD-10-CM

## 2021-09-16 DIAGNOSIS — R748 Abnormal levels of other serum enzymes: Secondary | ICD-10-CM

## 2021-09-16 LAB — COMPLETE METABOLIC PANEL WITH GFR
AG Ratio: 1.6 (calc) (ref 1.0–2.5)
ALT: 89 U/L — ABNORMAL HIGH (ref 9–46)
AST: 51 U/L — ABNORMAL HIGH (ref 10–40)
Albumin: 4.8 g/dL (ref 3.6–5.1)
Alkaline phosphatase (APISO): 73 U/L (ref 36–130)
BUN: 14 mg/dL (ref 7–25)
CO2: 28 mmol/L (ref 20–32)
Calcium: 9.5 mg/dL (ref 8.6–10.3)
Chloride: 102 mmol/L (ref 98–110)
Creat: 0.76 mg/dL (ref 0.60–1.26)
Globulin: 3 g/dL (calc) (ref 1.9–3.7)
Glucose, Bld: 93 mg/dL (ref 65–99)
Potassium: 5.2 mmol/L (ref 3.5–5.3)
Sodium: 139 mmol/L (ref 135–146)
Total Bilirubin: 0.7 mg/dL (ref 0.2–1.2)
Total Protein: 7.8 g/dL (ref 6.1–8.1)
eGFR: 120 mL/min/{1.73_m2} (ref >=60)

## 2021-09-16 LAB — IRON,TIBC AND FERRITIN PANEL
%SAT: 36 % (calc) (ref 20–48)
Ferritin: 396 ng/mL — ABNORMAL HIGH (ref 38–380)
Iron: 132 ug/dL (ref 50–180)
TIBC: 363 mcg/dL (calc) (ref 250–425)

## 2021-09-16 LAB — CBC WITH DIFFERENTIAL/PLATELET
Absolute Monocytes: 615 cells/uL (ref 200–950)
Basophils Absolute: 99 cells/uL (ref 0–200)
Basophils Relative: 1.7 %
Eosinophils Absolute: 151 cells/uL (ref 15–500)
Eosinophils Relative: 2.6 %
HCT: 51.8 % — ABNORMAL HIGH (ref 38.5–50.0)
Hemoglobin: 17.5 g/dL — ABNORMAL HIGH (ref 13.2–17.1)
Lymphs Abs: 1218 cells/uL (ref 850–3900)
MCH: 31.9 pg (ref 27.0–33.0)
MCHC: 33.8 g/dL (ref 32.0–36.0)
MCV: 94.5 fL (ref 80.0–100.0)
MPV: 10.7 fL (ref 7.5–12.5)
Monocytes Relative: 10.6 %
Neutro Abs: 3718 cells/uL (ref 1500–7800)
Neutrophils Relative %: 64.1 %
Platelets: 265 10*3/uL (ref 140–400)
RBC: 5.48 10*6/uL (ref 4.20–5.80)
RDW: 12.3 % (ref 11.0–15.0)
Total Lymphocyte: 21 %
WBC: 5.8 10*3/uL (ref 3.8–10.8)

## 2021-09-16 LAB — LIPID PANEL W/REFLEX DIRECT LDL
Cholesterol: 242 mg/dL — ABNORMAL HIGH (ref <200)
HDL: 58 mg/dL (ref >=40)
LDL Cholesterol (Calc): 160 mg/dL (calc) — ABNORMAL HIGH
Non-HDL Cholesterol (Calc): 184 mg/dL (calc) — ABNORMAL HIGH (ref <130)
Total CHOL/HDL Ratio: 4.2 (calc) (ref <5.0)
Triglycerides: 120 mg/dL (ref <150)

## 2021-09-16 LAB — TSH: TSH: 3.96 mIU/L (ref 0.40–4.50)

## 2021-10-09 ENCOUNTER — Encounter: Payer: Self-pay | Admitting: Physician Assistant

## 2021-10-11 NOTE — Telephone Encounter (Signed)
OK to reschedule per note so labs can be done all together

## 2021-10-12 ENCOUNTER — Ambulatory Visit: Payer: Self-pay | Admitting: Physician Assistant

## 2021-12-13 ENCOUNTER — Other Ambulatory Visit: Payer: Self-pay

## 2021-12-13 MED ORDER — SYNTHROID 112 MCG PO TABS
112.0000 ug | ORAL_TABLET | Freq: Every day | ORAL | 0 refills | Status: DC
Start: 2021-12-13 — End: 2022-03-24

## 2022-01-02 ENCOUNTER — Encounter: Payer: Self-pay | Admitting: Physician Assistant

## 2022-01-02 ENCOUNTER — Ambulatory Visit (INDEPENDENT_AMBULATORY_CARE_PROVIDER_SITE_OTHER): Payer: Self-pay | Admitting: Physician Assistant

## 2022-01-02 VITALS — BP 128/90 | HR 95 | Ht 66.0 in | Wt 203.0 lb

## 2022-01-02 DIAGNOSIS — R748 Abnormal levels of other serum enzymes: Secondary | ICD-10-CM

## 2022-01-02 DIAGNOSIS — Z79899 Other long term (current) drug therapy: Secondary | ICD-10-CM

## 2022-01-02 DIAGNOSIS — Z1321 Encounter for screening for nutritional disorder: Secondary | ICD-10-CM

## 2022-01-02 DIAGNOSIS — D582 Other hemoglobinopathies: Secondary | ICD-10-CM

## 2022-01-02 DIAGNOSIS — E039 Hypothyroidism, unspecified: Secondary | ICD-10-CM

## 2022-01-02 DIAGNOSIS — R030 Elevated blood-pressure reading, without diagnosis of hypertension: Secondary | ICD-10-CM

## 2022-01-02 NOTE — Progress Notes (Signed)
Established Patient Office Visit  Subjective   Patient ID: Ian Sawyer, male    DOB: 04-22-1986  Age: 36 y.o. MRN: [id number]  Chief Complaint  Patient presents with   Follow-up    HPI Pt is a 36 yo male who presents to the clinic to follow up on hypothyroidism. Overall he is feeling well. No concerns. He has started back weight lifting. He has increased protein and avoiding sugars/fried/fatty foods. He has noticed his BP has been high a few times over the past 6 months. Mostly the bottom number over 90 but at times the top number over 140. When it gets really high he does have a headache.   Last labs hemoglobin and liver enzymes were elevated but he did not follow up on this.   Patient Active Problem List   Diagnosis Date Noted   Elevated blood pressure reading 09/13/2021   Tachycardia 04/20/2021   HELICOBACTER PYLORI GASTRITIS 11/08/2006   Hypothyroidism 11/08/2006   HYPERLIPIDEMIA 11/08/2006   Past Medical History:  Diagnosis Date   History of CT scan of abdomen 03/06/2003   /pelvis-nml   Hyperlipidemia    Hypothyroidism    Ruptured spleen 10/04   (playing hockey) bx'd conservatively   Family History  Problem Relation Age of Onset   Cancer Father        spinal/bone (biological)   Alcohol abuse Father    Heart disease Maternal Grandfather        MI   No Known Allergies    ROS See HPI.    Objective:     BP (!) 128/90   Pulse 95   Ht 5\' 6"  (1.676 m)   Wt 203 lb (92.1 kg)   SpO2 99%   BMI 32.77 kg/m  BP Readings from Last 3 Encounters:  01/02/22 (!) 128/90  09/13/21 (!) 156/95  04/20/21 120/84   Wt Readings from Last 3 Encounters:  01/02/22 203 lb (92.1 kg)  09/13/21 210 lb (95.3 kg)  04/20/21 221 lb (100.2 kg)      Physical Exam Constitutional:      Appearance: Normal appearance. He is obese.  HENT:     Head: Normocephalic.  Neck:     Vascular: No carotid bruit.  Cardiovascular:     Rate and Rhythm: Normal rate and regular rhythm.      Pulses: Normal pulses.     Heart sounds: Normal heart sounds.  Pulmonary:     Effort: Pulmonary effort is normal.     Breath sounds: Normal breath sounds.  Musculoskeletal:     Cervical back: Normal range of motion and neck supple. No tenderness.     Right lower leg: No edema.     Left lower leg: No edema.  Lymphadenopathy:     Cervical: No cervical adenopathy.  Skin:    Comments: Erythematous macular patch on bilateral anterior shins  Neurological:     General: No focal deficit present.     Mental Status: He is oriented to person, place, and time.  Psychiatric:        Mood and Affect: Mood normal.        Assessment & Plan:  .14/07/22[name] was seen today for follow-up.  Diagnoses and all orders for this visit:  Acquired hypothyroidism -     TSH -     T4, free -     T3, free -     BASIC METABOLIC PANEL WITH GFR -     Hepatic function panel  Elevated liver  enzymes -     CBC w/Diff/Platelet -     Hepatic function panel  Elevated hemoglobin (HCC) -     CBC w/Diff/Platelet -     Fe+TIBC+Fer -     BASIC METABOLIC PANEL WITH GFR -     Hepatic function panel  Elevated blood pressure reading -     BASIC METABOLIC PANEL WITH GFR  Medication management -     VITAMIN D 25 Hydroxy (Vit-D Deficiency, Fractures)  Encounter for vitamin deficiency screening -     VITAMIN D 25 Hydroxy (Vit-D Deficiency, Fractures)    Declined all vaccines.  Will get labs to follow up on thyroid and elevated levels at last check Pt is taking vitamin D and would like checked BP diastolic elevated today he would like to continue to work on weight and diet before medications Explained risk of elevated BP over time Follow up in 6 months   Tandy Gaw, PA-C

## 2022-01-03 LAB — IRON,TIBC AND FERRITIN PANEL
%SAT: 21 % (calc) (ref 20–48)
Ferritin: 426 ng/mL — ABNORMAL HIGH (ref 38–380)
Iron: 63 ug/dL (ref 50–180)
TIBC: 304 mcg/dL (calc) (ref 250–425)

## 2022-01-03 LAB — BASIC METABOLIC PANEL WITH GFR
BUN: 14 mg/dL (ref 7–25)
CO2: 25 mmol/L (ref 20–32)
Calcium: 9.6 mg/dL (ref 8.6–10.3)
Chloride: 104 mmol/L (ref 98–110)
Creat: 0.75 mg/dL (ref 0.60–1.26)
Glucose, Bld: 82 mg/dL (ref 65–99)
Potassium: 5 mmol/L (ref 3.5–5.3)
Sodium: 141 mmol/L (ref 135–146)
eGFR: 120 mL/min/{1.73_m2} (ref >=60)

## 2022-01-03 LAB — CBC WITH DIFFERENTIAL/PLATELET
Absolute Monocytes: 366 cells/uL (ref 200–950)
Basophils Absolute: 102 cells/uL (ref 0–200)
Basophils Relative: 1.7 %
Eosinophils Absolute: 366 cells/uL (ref 15–500)
Eosinophils Relative: 6.1 %
HCT: 48.7 % (ref 38.5–50.0)
Hemoglobin: 16.6 g/dL (ref 13.2–17.1)
Lymphs Abs: 1248 cells/uL (ref 850–3900)
MCH: 32.5 pg (ref 27.0–33.0)
MCHC: 34.1 g/dL (ref 32.0–36.0)
MCV: 95.5 fL (ref 80.0–100.0)
MPV: 10.3 fL (ref 7.5–12.5)
Monocytes Relative: 6.1 %
Neutro Abs: 3918 cells/uL (ref 1500–7800)
Neutrophils Relative %: 65.3 %
Platelets: 304 10*3/uL (ref 140–400)
RBC: 5.1 10*6/uL (ref 4.20–5.80)
RDW: 11.8 % (ref 11.0–15.0)
Total Lymphocyte: 20.8 %
WBC: 6 10*3/uL (ref 3.8–10.8)

## 2022-01-03 LAB — T3, FREE: T3, Free: 4.1 pg/mL (ref 2.3–4.2)

## 2022-01-03 LAB — HEPATIC FUNCTION PANEL
AG Ratio: 1.6 (calc) (ref 1.0–2.5)
ALT: 95 U/L — ABNORMAL HIGH (ref 9–46)
AST: 61 U/L — ABNORMAL HIGH (ref 10–40)
Albumin: 4.7 g/dL (ref 3.6–5.1)
Alkaline phosphatase (APISO): 73 U/L (ref 36–130)
Bilirubin, Direct: 0.1 mg/dL (ref 0.0–0.2)
Globulin: 2.9 g/dL (calc) (ref 1.9–3.7)
Indirect Bilirubin: 0.2 mg/dL (calc) (ref 0.2–1.2)
Total Bilirubin: 0.3 mg/dL (ref 0.2–1.2)
Total Protein: 7.6 g/dL (ref 6.1–8.1)

## 2022-01-03 LAB — T4, FREE: Free T4: 1.3 ng/dL (ref 0.8–1.8)

## 2022-01-03 LAB — TSH: TSH: 3.28 mIU/L (ref 0.40–4.50)

## 2022-01-03 LAB — VITAMIN D 25 HYDROXY (VIT D DEFICIENCY, FRACTURES): Vit D, 25-Hydroxy: 63 ng/mL (ref 30–100)

## 2022-01-03 NOTE — Progress Notes (Signed)
Hemoglobin and hematocrit improved from 3 months ago.  Some labs still pending.

## 2022-01-04 NOTE — Progress Notes (Signed)
Ian Sawyer,   Thyroid normal range with normal T4 and T3! Kidney and glucose look good.  Vitamin d looks great.  Your liver enzymes continue to be elevated and your iron stores are high. Serum iron looks good.  Remind me to you drink any alcohol?  I would like to do liver ultrasound to further evaluate. Thoughts?

## 2022-01-05 ENCOUNTER — Encounter: Payer: Self-pay | Admitting: Physician Assistant

## 2022-01-06 ENCOUNTER — Encounter: Payer: Self-pay | Admitting: Physician Assistant

## 2022-01-06 DIAGNOSIS — Z789 Other specified health status: Secondary | ICD-10-CM | POA: Insufficient documentation

## 2022-03-24 ENCOUNTER — Other Ambulatory Visit: Payer: Self-pay | Admitting: Physician Assistant

## 2022-03-24 MED ORDER — SYNTHROID 112 MCG PO TABS: 112.0000 ug | ORAL_TABLET | Freq: Every day | ORAL | 0 refills | Status: DC

## 2022-07-05 ENCOUNTER — Ambulatory Visit: Payer: Self-pay | Admitting: Physician Assistant

## 2022-07-05 ENCOUNTER — Encounter: Payer: Self-pay | Admitting: Physician Assistant

## 2022-07-05 VITALS — BP 135/85 | HR 104 | Ht 66.0 in | Wt 207.0 lb

## 2022-07-05 DIAGNOSIS — R030 Elevated blood-pressure reading, without diagnosis of hypertension: Secondary | ICD-10-CM

## 2022-07-05 DIAGNOSIS — E039 Hypothyroidism, unspecified: Secondary | ICD-10-CM

## 2022-07-05 DIAGNOSIS — R748 Abnormal levels of other serum enzymes: Secondary | ICD-10-CM | POA: Insufficient documentation

## 2022-07-05 DIAGNOSIS — Z13220 Encounter for screening for lipoid disorders: Secondary | ICD-10-CM

## 2022-07-05 DIAGNOSIS — Z789 Other specified health status: Secondary | ICD-10-CM

## 2022-07-05 NOTE — Progress Notes (Signed)
Established Patient Office Visit  Subjective   Patient ID: Ian Sawyer, male    DOB: [birth year]  Age: 37 y.o. MRN: [id number]  Chief Complaint  Patient presents with   Follow-up    HPI Pt is a 37 yo male with Hypothyroidism and elevated liver enzymes who presents to the clinic for follow up.   He is doing better. He is taking his synthroid daily. He has lost about 20lbs. He continues to drink about 6-12 beers a day. He does not check his BP at home. He denies any CP, palpitations, headaches or vision changes.   .. Active Ambulatory Problems    Diagnosis Date Noted   HELICOBACTER PYLORI GASTRITIS 11/08/2006   Hypothyroidism 11/08/2006   HYPERLIPIDEMIA 11/08/2006   Tachycardia 04/20/2021   Elevated blood pressure reading 09/13/2021   Daily consumption of alcohol 01/06/2022   Elevated liver enzymes 07/05/2022   Resolved Ambulatory Problems    Diagnosis Date Noted   Other malaise and fatigue 11/28/2007   LUQ pain 01/25/2011   Past Medical History:  Diagnosis Date   History of CT scan of abdomen 03/06/2003   Hyperlipidemia    Ruptured spleen 10/04     ROS See HPI.    Objective:     BP 135/85   Pulse (!) 104   Ht 5' 6"$  (1.676 m)   Wt 207 lb (93.9 kg)   SpO2 99%   BMI 33.41 kg/m  BP Readings from Last 3 Encounters:  07/05/22 135/85  01/02/22 (!) 128/90  09/13/21 (!) 156/95   Wt Readings from Last 3 Encounters:  07/05/22 207 lb (93.9 kg)  01/02/22 203 lb (92.1 kg)  09/13/21 210 lb (95.3 kg)    ..    07/05/2022    7:22 AM 01/02/2022    8:48 AM 09/13/2021    9:13 AM 04/20/2021    8:14 AM 04/05/2018    1:12 PM  Depression screen PHQ 2/9  Decreased Interest 0 0 0 0 0  Down, Depressed, Hopeless 0 0 0 0 0  PHQ - 2 Score 0 0 0 0 0  Altered sleeping    0 0  Tired, decreased energy    0 0  Change in appetite    0 0  Feeling bad or failure about yourself     0 0  Trouble concentrating    0 0  Moving slowly or fidgety/restless    0 0  Suicidal thoughts     0 0  PHQ-9 Score    0 0  Difficult doing work/chores    Not difficult at all Not difficult at all     Physical Exam Constitutional:      Appearance: Normal appearance. He is obese.  HENT:     Head: Normocephalic.  Neck:     Vascular: No carotid bruit.  Cardiovascular:     Rate and Rhythm: Regular rhythm. Tachycardia present.     Pulses: Normal pulses.     Heart sounds: Normal heart sounds.  Pulmonary:     Effort: Pulmonary effort is normal.  Abdominal:     General: There is no distension.     Palpations: Abdomen is soft. There is no mass.     Tenderness: There is no abdominal tenderness. There is no guarding or rebound.     Hernia: No hernia is present.  Musculoskeletal:     Cervical back: Normal range of motion and neck supple. No rigidity.     Right lower leg: No edema.  Left lower leg: No edema.  Lymphadenopathy:     Cervical: No cervical adenopathy.  Neurological:     General: No focal deficit present.     Mental Status: He is alert and oriented to person, place, and time.  Psychiatric:        Mood and Affect: Mood normal.        Assessment & Plan:  .Marland KitchenDeaven was seen today for follow-up.  Diagnoses and all orders for this visit:  Acquired hypothyroidism -     TSH -     T4, free -     T3, free  Pre-hypertension  Daily consumption of alcohol -     Hepatic function panel -     BASIC METABOLIC PANEL WITH GFR -     Hepatitis panel, acute  Elevated liver enzymes -     TSH -     T4, free -     T3, free -     Hepatic function panel -     BASIC METABOLIC PANEL WITH GFR -     Lipid Panel w/reflex Direct LDL -     Hepatitis panel, acute  Screening for lipid disorders -     Lipid Panel w/reflex Direct LDL   2nd BP was better but still in pre-hypertensive range Discussed prevention with diet and lifestyle changes I would still like for him to cut back on alcohol intake cutting in half would be 6 beers a day Will recheck labs Declines all  vaccines    Iran Planas, PA-C

## 2022-07-05 NOTE — Patient Instructions (Signed)
Preventing Hypertension Hypertension, also called high blood pressure, is when the force of blood pumping through the arteries is too strong. Arteries are blood vessels that carry blood from the heart throughout the body. Often, hypertension does not cause symptoms until blood pressure is very high. It is important to have your blood pressure checked regularly. Diet and lifestyle changes can help you prevent hypertension, and they may make you feel better overall and improve your quality of life. If you already have hypertension, you may control it with diet and lifestyle changes, as well as with medicine. How can this condition affect me? Over time, hypertension can damage the arteries and decrease blood flow to important parts of the body, including the brain, heart, and kidneys. By keeping your blood pressure in a healthy range, you can help prevent complications like heart attack, heart failure, stroke, kidney failure, and vascular dementia. What can increase my risk? An unhealthy diet and a lack of physical activity can make you more likely to develop high blood pressure. Some other risk factors include: Age. The risk increases with age. Having family members who have had high blood pressure. Having certain health conditions, such as thyroid problems. Being overweight or obese. Drinking too much alcohol or caffeine. Having too much fat, sugar, calories, or salt (sodium) in your diet. Smoking or using illegal drugs. Taking certain medicines, such as antidepressants, decongestants, birth control pills, and NSAIDs, such as ibuprofen. What actions can I take to prevent or manage this condition? Work with your health care provider to make a hypertension prevention plan that works for you. You may be referred for counseling on a healthy diet and physical activity. Follow your plan and keep all follow-up visits. Diet changes Maintain a healthy diet. This includes: Eating less salt (sodium). Ask your  health care provider how much sodium is safe for you to have. The general recommendation is to have less than 1 tsp (2,300 mg) of sodium a day. Do not add salt to your food. Choose low-sodium options when grocery shopping and eating out. Limiting fats in your diet. You can do this by eating low-fat or fat-free dairy products and by eating less red meat. Eating more fruits, vegetables, and whole grains. Make a goal to eat: 1-2 cups of fresh fruits and vegetables each day. 3-4 servings of whole grains each day. Avoiding foods and beverages that have added sugars. Eating fish that contain healthy fats (omega-3 fatty acids), such as mackerel or salmon. If you need help putting together a healthy eating plan, try the DASH diet. This diet is high in fruits, vegetables, and whole grains. It is low in sodium, red meat, and added sugars. DASH stands for Dietary Approaches to Stop Hypertension. Lifestyle changes  Lose weight if you are overweight. Losing just 3-5% of your body weight can help prevent or control hypertension. For example, if your present weight is 200 lb (91 kg), a loss of 3-5% of your weight means losing 6-10 lb (2.7-4.5 kg). Ask your health care provider to help you with a diet and exercise plan to safely lose weight. Get enough exercise. Do at least 150 minutes of moderate-intensity exercise each week. You could do this in short exercise sessions several times a day, or you could do longer exercise sessions a few times a week. For example, you could take a brisk 10-minute walk or bike ride, 3 times a day, for 5 days a week. Find ways to reduce stress, such as exercising, meditating, listening to  music, or taking a yoga class. If you need help reducing stress, ask your health care provider. Do not use any products that contain nicotine or tobacco. These products include cigarettes, chewing tobacco, and vaping devices, such as e-cigarettes. Chemicals in tobacco and nicotine products raise your  blood pressure each time you use them. If you need help quitting, ask your health care provider. Learn how to check your blood pressure at home. Make sure that you know your personal target blood pressure, as told by your health care provider. Try to sleep 7-9 hours per night. Alcohol use Do not drink alcohol if: Your health care provider tells you not to drink. You are pregnant, may be pregnant, or are planning to become pregnant. If you drink alcohol: Limit how much you have to: 0-1 drink a day for women. 0-2 drinks a day for men. Know how much alcohol is in your drink. In the [place] one drink equals one 12 oz bottle of beer (355 mL), one 5 oz glass of wine (148 mL), or one 1 oz glass of hard liquor (44 mL). Medicines In addition to diet and lifestyle changes, your health care provider may recommend medicines to help lower your blood pressure. In general: You may need to try a few different medicines to find what works best for you. You may need to take more than one medicine. Take over-the-counter and prescription medicines only as told by your health care provider. Questions to ask your health care provider What is my blood pressure goal? How can I lower my risk for high blood pressure? How should I monitor my blood pressure at home? Where to find support Your health care provider can help you prevent hypertension and help you keep your blood pressure at a healthy level. Your local hospital or your community may also provide support services and prevention programs. The American Heart Association offers an online support network at supportnetwork.heart.org Where to find more information Learn more about hypertension from: [place], Lung, and [place]: https://wilson-eaton.com/ Centers for Disease Control and Prevention: http://www.wolf.info/ American Academy of Family Physicians: familydoctor.org Learn more about the DASH diet from: [place], Lung, and [place]:  https://wilson-eaton.com/ Contact a health care provider if: You think you are having a reaction to medicines you have taken. You have recurrent headaches or feel dizzy. You have swelling in your ankles. You have trouble with your vision. Get help right away if: You have sudden, severe chest, back, or abdominal pain or discomfort. You have shortness of breath. You have a sudden, severe headache. These symptoms may be an emergency. Get help right away. Call 911. Do not wait to see if the symptoms will go away. Do not drive yourself to the hospital. Summary Hypertension often does not cause any symptoms until blood pressure is very high. It is important to get your blood pressure checked regularly. Diet and lifestyle changes are important steps in preventing hypertension. By keeping your blood pressure in a healthy range, you may prevent complications like heart attack, heart failure, stroke, and kidney failure. Work with your health care provider to make a hypertension prevention plan that works for you. This information is not intended to replace advice given to you by your health care provider. Make sure you discuss any questions you have with your health care provider. Document Revised: 02/17/2021 Document Reviewed: 02/17/2021 Elsevier Patient Education  [place].

## 2022-07-07 ENCOUNTER — Other Ambulatory Visit: Payer: Self-pay | Admitting: Physician Assistant

## 2022-07-10 MED ORDER — SYNTHROID 112 MCG PO TABS: 112.0000 ug | ORAL_TABLET | Freq: Every day | ORAL | 0 refills | Status: DC

## 2022-10-13 LAB — BASIC METABOLIC PANEL WITH GFR
BUN: 13 mg/dL (ref 7–25)
CO2: 26 mmol/L (ref 20–32)
Calcium: 9.8 mg/dL (ref 8.6–10.3)
Chloride: 101 mmol/L (ref 98–110)
Creat: 0.77 mg/dL (ref 0.60–1.26)
Glucose, Bld: 101 mg/dL — ABNORMAL HIGH (ref 65–99)
Potassium: 4.6 mmol/L (ref 3.5–5.3)
Sodium: 139 mmol/L (ref 135–146)
eGFR: 119 mL/min/{1.73_m2} (ref >=60)

## 2022-10-13 LAB — LIPID PANEL W/REFLEX DIRECT LDL
Cholesterol: 228 mg/dL — ABNORMAL HIGH (ref <200)
HDL: 67 mg/dL (ref >=40)
LDL Cholesterol (Calc): 142 mg/dL (calc) — ABNORMAL HIGH
Non-HDL Cholesterol (Calc): 161 mg/dL (calc) — ABNORMAL HIGH (ref <130)
Total CHOL/HDL Ratio: 3.4 (calc) (ref <5.0)
Triglycerides: 84 mg/dL (ref <150)

## 2022-10-13 LAB — T3, FREE: T3, Free: 3.5 pg/mL (ref 2.3–4.2)

## 2022-10-13 LAB — HEPATITIS PANEL, ACUTE
Hep A IgM: NONREACTIVE
Hep B C IgM: NONREACTIVE
Hepatitis B Surface Ag: NONREACTIVE
Hepatitis C Ab: NONREACTIVE

## 2022-10-13 LAB — HEPATIC FUNCTION PANEL
AG Ratio: 1.8 (calc) (ref 1.0–2.5)
ALT: 92 U/L — ABNORMAL HIGH (ref 9–46)
AST: 67 U/L — ABNORMAL HIGH (ref 10–40)
Albumin: 4.8 g/dL (ref 3.6–5.1)
Alkaline phosphatase (APISO): 82 U/L (ref 36–130)
Bilirubin, Direct: 0.3 mg/dL — ABNORMAL HIGH (ref 0.0–0.2)
Globulin: 2.6 g/dL (calc) (ref 1.9–3.7)
Indirect Bilirubin: 1 mg/dL (calc) (ref 0.2–1.2)
Total Bilirubin: 1.3 mg/dL — ABNORMAL HIGH (ref 0.2–1.2)
Total Protein: 7.4 g/dL (ref 6.1–8.1)

## 2022-10-13 LAB — TSH: TSH: 2.81 mIU/L (ref 0.40–4.50)

## 2022-10-13 LAB — T4, FREE: Free T4: 1.3 ng/dL (ref 0.8–1.8)

## 2022-10-16 ENCOUNTER — Encounter: Payer: Self-pay | Admitting: Physician Assistant

## 2022-10-16 ENCOUNTER — Other Ambulatory Visit: Payer: Self-pay | Admitting: Physician Assistant

## 2022-10-16 NOTE — Progress Notes (Signed)
AST and ALT still elevated. Hepatitis panel negative. I would like to get ultrasound electrogram to look at liver. Are you ok with this?  How much alcohol are you drinking?   LDL is better than last check. HDL looks good.   Thyroid looks good.

## 2022-10-17 MED ORDER — SYNTHROID 112 MCG PO TABS: 112.0000 ug | ORAL_TABLET | Freq: Every day | ORAL | 3 refills | Status: DC

## 2023-01-03 ENCOUNTER — Ambulatory Visit: Payer: Self-pay | Admitting: Physician Assistant

## 2023-01-03 ENCOUNTER — Encounter: Payer: Self-pay | Admitting: Physician Assistant

## 2023-01-03 VITALS — BP 123/84 | HR 92 | Ht 66.0 in | Wt 172.0 lb

## 2023-01-03 DIAGNOSIS — Z8679 Personal history of other diseases of the circulatory system: Secondary | ICD-10-CM | POA: Insufficient documentation

## 2023-01-03 DIAGNOSIS — E039 Hypothyroidism, unspecified: Secondary | ICD-10-CM

## 2023-01-03 DIAGNOSIS — D582 Other hemoglobinopathies: Secondary | ICD-10-CM

## 2023-01-03 DIAGNOSIS — R030 Elevated blood-pressure reading, without diagnosis of hypertension: Secondary | ICD-10-CM

## 2023-01-03 DIAGNOSIS — R748 Abnormal levels of other serum enzymes: Secondary | ICD-10-CM

## 2023-01-03 DIAGNOSIS — Z789 Other specified health status: Secondary | ICD-10-CM

## 2023-01-03 NOTE — Progress Notes (Signed)
   Established Patient Office Visit  Subjective   Patient ID: Ian Sawyer, male    DOB: 07-07-1985  Age: 37 y.o. MRN: [id number]  Chief Complaint  Patient presents with   Medical Management of Chronic Issues    6 mo fup thyriod    HPI Pt is a 37 yo male with hypothyroidism, pre-hypertension, and elevated liver enzymes.   Pt has been working on exercise and diet changes. He has lost 35lbs in the last 6 months. He is not checking BP at home. He continues to drink alcohol daily but does not characterize how much. He declined u/s of liver in the past. He denies any CP, palpitations, headaches, or vision changes. He is taking his levothyroxine every morning.   .. Active Ambulatory Problems    Diagnosis Date Noted   HELICOBACTER PYLORI GASTRITIS 11/08/2006   Hypothyroidism 11/08/2006   HYPERLIPIDEMIA 11/08/2006   Tachycardia 04/20/2021   Elevated blood pressure reading 09/13/2021   Daily consumption of alcohol 01/06/2022   Elevated liver enzymes 07/05/2022   Elevated hemoglobin (HCC) 01/03/2023   History of hypertension 01/03/2023   Resolved Ambulatory Problems    Diagnosis Date Noted   Other malaise and fatigue 11/28/2007   LUQ pain 01/25/2011   Past Medical History:  Diagnosis Date   History of CT scan of abdomen 03/06/2003   Hyperlipidemia    Ruptured spleen 10/04     ROS See HPI.    Objective:     BP 123/84   Pulse 92   Ht 5\' 6"  (1.676 m)   Wt 172 lb (78 kg)   SpO2 99%   BMI 27.76 kg/m  BP Readings from Last 3 Encounters:  01/03/23 123/84  07/05/22 135/85  01/02/22 (!) 128/90   Wt Readings from Last 3 Encounters:  01/03/23 172 lb (78 kg)  07/05/22 207 lb (93.9 kg)  01/02/22 203 lb (92.1 kg)      Physical Exam Constitutional:      Appearance: Normal appearance.  HENT:     Head: Normocephalic.  Cardiovascular:     Rate and Rhythm: Normal rate and regular rhythm.     Heart sounds: Normal heart sounds.  Pulmonary:     Effort: Pulmonary effort is  normal.     Breath sounds: Normal breath sounds.  Neurological:     General: No focal deficit present.     Mental Status: He is alert and oriented to person, place, and time.  Psychiatric:        Mood and Affect: Mood normal.       Assessment & Plan:  .Marland KitchenRune was seen today for medical management of chronic issues.  Diagnoses and all orders for this visit:  Acquired hypothyroidism -     TSH + free T4 -     BASIC METABOLIC PANEL WITH GFR  History of hypertension -     BASIC METABOLIC PANEL WITH GFR  Daily consumption of alcohol -     BASIC METABOLIC PANEL WITH GFR  Elevated liver enzymes -     Hepatic function panel  Elevated hemoglobin (HCC) -     CBC w/Diff/Platelet   Vitals look great Weight improving, keep up the good work Will recheck labs and make sure no adjustment is needed to levothyroxine Continue to exercise and cut back on alcohol    Return in about 1 year (around 01/03/2024).    Ian Gaw, PA-C

## 2023-01-30 ENCOUNTER — Other Ambulatory Visit: Payer: Self-pay

## 2023-01-30 DIAGNOSIS — R030 Elevated blood-pressure reading, without diagnosis of hypertension: Secondary | ICD-10-CM

## 2023-01-30 DIAGNOSIS — E039 Hypothyroidism, unspecified: Secondary | ICD-10-CM

## 2023-01-30 DIAGNOSIS — R748 Abnormal levels of other serum enzymes: Secondary | ICD-10-CM

## 2023-01-30 NOTE — Progress Notes (Signed)
Switched labs

## 2023-02-02 NOTE — Progress Notes (Signed)
Liver enzymes are up more. You really need to stop drinking and see if they go down or start getting a full work up on your liver.

## 2023-12-19 ENCOUNTER — Encounter: Payer: Self-pay | Admitting: Physician Assistant

## 2023-12-26 MED ORDER — SYNTHROID 112 MCG PO TABS: 112.0000 ug | ORAL_TABLET | Freq: Every day | ORAL | 1 refills | Status: DC

## 2024-01-09 ENCOUNTER — Ambulatory Visit: Payer: Self-pay | Admitting: Physician Assistant

## 2024-02-01 ENCOUNTER — Ambulatory Visit: Payer: Self-pay | Admitting: Physician Assistant

## 2024-04-05 ENCOUNTER — Encounter: Payer: Self-pay | Admitting: Physician Assistant

## 2024-04-07 MED ORDER — LEVOTHYROXINE SODIUM 88 MCG PO TABS: 88.0000 ug | ORAL_TABLET | Freq: Every day | ORAL | 0 refills | Status: DC

## 2024-04-14 MED ORDER — SYNTHROID 88 MCG PO TABS: 88.0000 ug | ORAL_TABLET | Freq: Every day | ORAL | 1 refills | Status: DC

## 2024-04-14 NOTE — Addendum Note (Signed)
 Addended by: ANTONIETTE VERMELL CROME on: 04/14/2024 01:18 PM   Modules accepted: Orders

## 2024-05-28 ENCOUNTER — Ambulatory Visit: Payer: Self-pay | Admitting: Physician Assistant

## 2024-05-28 ENCOUNTER — Encounter: Payer: Self-pay | Admitting: Physician Assistant

## 2024-05-28 VITALS — BP 131/80 | HR 107 | Ht 67.0 in | Wt 166.0 lb

## 2024-05-28 DIAGNOSIS — Z0000 Encounter for general adult medical examination without abnormal findings: Secondary | ICD-10-CM

## 2024-05-28 DIAGNOSIS — R000 Tachycardia, unspecified: Secondary | ICD-10-CM

## 2024-05-28 DIAGNOSIS — D582 Other hemoglobinopathies: Secondary | ICD-10-CM

## 2024-05-28 DIAGNOSIS — Z789 Other specified health status: Secondary | ICD-10-CM

## 2024-05-28 DIAGNOSIS — E782 Mixed hyperlipidemia: Secondary | ICD-10-CM

## 2024-05-28 DIAGNOSIS — E039 Hypothyroidism, unspecified: Secondary | ICD-10-CM

## 2024-05-28 DIAGNOSIS — L409 Psoriasis, unspecified: Secondary | ICD-10-CM

## 2024-05-28 DIAGNOSIS — Z131 Encounter for screening for diabetes mellitus: Secondary | ICD-10-CM

## 2024-05-28 NOTE — Patient Instructions (Signed)
 Health Maintenance, Male  Adopting a healthy lifestyle and getting preventive care are important in promoting health and wellness. Ask your health care provider about:  The right schedule for you to have regular tests and exams.  Things you can do on your own to prevent diseases and keep yourself healthy.  What should I know about diet, weight, and exercise?  Eat a healthy diet    Eat a diet that includes plenty of vegetables, fruits, low-fat dairy products, and lean protein.  Do not eat a lot of foods that are high in solid fats, added sugars, or sodium.  Maintain a healthy weight  Body mass index (BMI) is a measurement that can be used to identify possible weight problems. It estimates body fat based on height and weight. Your health care provider can help determine your BMI and help you achieve or maintain a healthy weight.  Get regular exercise  Get regular exercise. This is one of the most important things you can do for your health. Most adults should:  Exercise for at least 150 minutes each week. The exercise should increase your heart rate and make you sweat (moderate-intensity exercise).  Do strengthening exercises at least twice a week. This is in addition to the moderate-intensity exercise.  Spend less time sitting. Even light physical activity can be beneficial.  Watch cholesterol and blood lipids  Have your blood tested for lipids and cholesterol at 39 years of age, then have this test every 5 years.  You may need to have your cholesterol levels checked more often if:  Your lipid or cholesterol levels are high.  You are older than 39 years of age.  You are at high risk for heart disease.  What should I know about cancer screening?  Many types of cancers can be detected early and may often be prevented. Depending on your health history and family history, you may need to have cancer screening at various ages. This may include screening for:  Colorectal cancer.  Prostate cancer.  Skin cancer.  Lung  cancer.  What should I know about heart disease, diabetes, and high blood pressure?  Blood pressure and heart disease  High blood pressure causes heart disease and increases the risk of stroke. This is more likely to develop in people who have high blood pressure readings or are overweight.  Talk with your health care provider about your target blood pressure readings.  Have your blood pressure checked:  Every 3-5 years if you are 24-52 years of age.  Every year if you are 3 years old or older.  If you are between the ages of 60 and 72 and are a current or former smoker, ask your health care provider if you should have a one-time screening for abdominal aortic aneurysm (AAA).  Diabetes  Have regular diabetes screenings. This checks your fasting blood sugar level. Have the screening done:  Once every three years after age 66 if you are at a normal weight and have a low risk for diabetes.  More often and at a younger age if you are overweight or have a high risk for diabetes.  What should I know about preventing infection?  Hepatitis B  If you have a higher risk for hepatitis B, you should be screened for this virus. Talk with your health care provider to find out if you are at risk for hepatitis B infection.  Hepatitis C  Blood testing is recommended for:  Everyone born from [birth year] through 1965.  Anyone  with known risk factors for hepatitis C.  Sexually transmitted infections (STIs)  You should be screened each year for STIs, including gonorrhea and chlamydia, if:  You are sexually active and are younger than 39 years of age.  You are older than 39 years of age and your health care provider tells you that you are at risk for this type of infection.  Your sexual activity has changed since you were last screened, and you are at increased risk for chlamydia or gonorrhea. Ask your health care provider if you are at risk.  Ask your health care provider about whether you are at high risk for HIV. Your health care provider  may recommend a prescription medicine to help prevent HIV infection. If you choose to take medicine to prevent HIV, you should first get tested for HIV. You should then be tested every 3 months for as long as you are taking the medicine.  Follow these instructions at home:  Alcohol use  Do not drink alcohol if your health care provider tells you not to drink.  If you drink alcohol:  Limit how much you have to 0-2 drinks a day.  Know how much alcohol is in your drink. In the [place] one drink equals one 12 oz bottle of beer (355 mL), one 5 oz glass of wine (148 mL), or one 1 oz glass of hard liquor (44 mL).  Lifestyle  Do not use any products that contain nicotine or tobacco. These products include cigarettes, chewing tobacco, and vaping devices, such as e-cigarettes. If you need help quitting, ask your health care provider.  Do not use street drugs.  Do not share needles.  Ask your health care provider for help if you need support or information about quitting drugs.  General instructions  Schedule regular health, dental, and eye exams.  Stay current with your vaccines.  Tell your health care provider if:  You often feel depressed.  You have ever been abused or do not feel safe at home.  Summary  Adopting a healthy lifestyle and getting preventive care are important in promoting health and wellness.  Follow your health care provider's instructions about healthy diet, exercising, and getting tested or screened for diseases.  Follow your health care provider's instructions on monitoring your cholesterol and blood pressure.  This information is not intended to replace advice given to you by your health care provider. Make sure you discuss any questions you have with your health care provider.  Document Revised: 09/20/2020 Document Reviewed: 09/20/2020  Elsevier Patient Education  2024 [employer].

## 2024-05-29 LAB — CBC WITH DIFFERENTIAL/PLATELET
Basophils Absolute: 0.1 x10E3/uL (ref 0.0–0.2)
Basos: 2 %
EOS (ABSOLUTE): 0.1 x10E3/uL (ref 0.0–0.4)
Eos: 3 %
Hematocrit: 48.7 % (ref 37.5–51.0)
Hemoglobin: 16.4 g/dL (ref 13.0–17.7)
Immature Grans (Abs): 0 x10E3/uL (ref 0.0–0.1)
Immature Granulocytes: 0 %
Lymphocytes Absolute: 0.9 x10E3/uL (ref 0.7–3.1)
Lymphs: 25 %
MCH: 33.9 pg — ABNORMAL HIGH (ref 26.6–33.0)
MCHC: 33.7 g/dL (ref 31.5–35.7)
MCV: 101 fL — ABNORMAL HIGH (ref 79–97)
Monocytes Absolute: 0.4 x10E3/uL (ref 0.1–0.9)
Monocytes: 11 %
Neutrophils Absolute: 2.1 x10E3/uL (ref 1.4–7.0)
Neutrophils: 58 %
Platelets: 217 x10E3/uL (ref 150–450)
RBC: 4.84 x10E6/uL (ref 4.14–5.80)
RDW: 11.6 % (ref 11.6–15.4)
WBC: 3.6 x10E3/uL (ref 3.4–10.8)

## 2024-05-29 LAB — LIPID PANEL
Chol/HDL Ratio: 3.4 ratio (ref 0.0–5.0)
Cholesterol, Total: 215 mg/dL — ABNORMAL HIGH (ref 100–199)
HDL: 64 mg/dL
LDL Chol Calc (NIH): 121 mg/dL — ABNORMAL HIGH (ref 0–99)
Triglycerides: 172 mg/dL — ABNORMAL HIGH (ref 0–149)
VLDL Cholesterol Cal: 30 mg/dL (ref 5–40)

## 2024-05-29 LAB — CMP14+EGFR
ALT: 64 IU/L — ABNORMAL HIGH (ref 0–44)
AST: 65 IU/L — ABNORMAL HIGH (ref 0–40)
Albumin: 4.7 g/dL (ref 4.1–5.1)
Alkaline Phosphatase: 74 IU/L (ref 47–123)
BUN/Creatinine Ratio: 15 (ref 9–20)
BUN: 12 mg/dL (ref 6–20)
Bilirubin Total: 0.5 mg/dL (ref 0.0–1.2)
CO2: 23 mmol/L (ref 20–29)
Calcium: 9.5 mg/dL (ref 8.7–10.2)
Chloride: 99 mmol/L (ref 96–106)
Creatinine, Ser: 0.78 mg/dL (ref 0.76–1.27)
Globulin, Total: 2.7 g/dL (ref 1.5–4.5)
Glucose: 73 mg/dL (ref 70–99)
Potassium: 4.9 mmol/L (ref 3.5–5.2)
Sodium: 140 mmol/L (ref 134–144)
Total Protein: 7.4 g/dL (ref 6.0–8.5)
eGFR: 117 mL/min/1.73

## 2024-05-29 LAB — IRON,TIBC AND FERRITIN PANEL
Ferritin: 967 ng/mL — ABNORMAL HIGH (ref 30–400)
Iron Saturation: 32 % (ref 15–55)
Iron: 93 ug/dL (ref 38–169)
Total Iron Binding Capacity: 295 ug/dL (ref 250–450)
UIBC: 202 ug/dL (ref 111–343)

## 2024-05-29 LAB — TSH+FREE T4
Free T4: 1.32 ng/dL (ref 0.82–1.77)
TSH: 5.46 u[IU]/mL — ABNORMAL HIGH (ref 0.450–4.500)

## 2024-05-30 ENCOUNTER — Ambulatory Visit: Payer: Self-pay | Admitting: Physician Assistant

## 2024-05-30 MED ORDER — SYNTHROID 88 MCG PO TABS: 88.0000 ug | ORAL_TABLET | Freq: Every day | ORAL | 1 refills | Status: AC

## 2024-05-30 NOTE — Progress Notes (Signed)
Attempted call to patient . Left voice mail message requesting a return call. Also noted on message that pt could check Mychart for results as well.   

## 2024-05-30 NOTE — Progress Notes (Signed)
 Seab,   Your thyroid  dose based on free T4 levels looks good. Your TSH is trending towards lower thyroid  levels. We should recheck in 3-6 months and keep an eye on this.   Liver enzymes are elevated but not as much as before.   Ferritin levels are elevated and suspect due to inflammation and alcohol use. I would like to order ultrasound of the liver.   LDL not optimal but looks pretty good.

## 2024-06-01 NOTE — Progress Notes (Signed)
 "  Complete physical exam  Patient: Ian Sawyer   DOB: 1986-03-09   39 y.o. Male  MRN: [id number]  Subjective:    Chief Complaint  Patient presents with   Annual Exam   .Discussed the use of AI scribe software for clinical note transcription with the patient, who gave verbal consent to proceed.  History of Present Illness Ian Sawyer is a 39 year old male who presents for medication management and follow-up.  Thyroid  dysfunction - Resumed consistent use of thyroid  medication after a lapse of approximately one year - Symptoms necessitating medication re-emerged after about 12-16 months without medication - Currently feels well on levothyroxine  88 mcg  Cardiovascular symptoms and weight management - Heart problems appear improved - No chest pain or peripheral edema - Blood pressure and heart rate may benefit from resuming weightlifting - Weight decreased from a peak of 232 pounds to 165 pounds - Goal weight is 155 pounds  Sleep disturbance - Sleep duration is suboptimal, averaging seven hours per night - Prefers nine hours of sleep - Does not consider current sleep pattern a major health concern  Alcohol use - Alcohol consumption unchanged - Willing to reduce intake if liver enzyme levels are elevated - No abdominal pain or dysphagia  Psoriasis - Intermittent psoriasis present on legs, with one leg slightly worse than the other - Managed with regular showers and exfoliation - History of flare-ups, managed without additional medication    Most recent fall risk assessment:    05/28/2024    2:35 PM  Fall Risk   Falls in the past year? 0  Number falls in past yr: 0  Injury with Fall? 0     Most recent depression screenings:    05/28/2024    2:34 PM 01/03/2023    7:05 AM  PHQ 2/9 Scores  PHQ - 2 Score 0 0  PHQ- 9 Score 0     Vision:Not within last year  and Dental: No current dental problems and Receives regular dental care  Patient Active Problem List    Diagnosis Date Noted   Psoriasis 05/28/2024   Elevated hemoglobin 01/03/2023   History of hypertension 01/03/2023   Elevated liver enzymes 07/05/2022   Daily consumption of alcohol 01/06/2022   Elevated blood pressure reading 09/13/2021   Tachycardia 04/20/2021   Helicobacter pylori infection 11/08/2006   Hypothyroidism 11/08/2006   Mixed hyperlipidemia 11/08/2006   Past Medical History:  Diagnosis Date   History of CT scan of abdomen 03/06/2003   /pelvis-nml   Hyperlipidemia    Hypothyroidism    Ruptured spleen 10/04   (playing hockey) bx'd conservatively   Past Surgical History:  Procedure Laterality Date   ESOPHAGOGASTRODUODENOSCOPY  10/04   nothing (nml) ORR   MOLE REMOVAL  2005   (Derm)   TONSILLECTOMY  1995   Family History  Problem Relation Age of Onset   Cancer Father        spinal/bone (biological)   Alcohol abuse Father    Heart disease Maternal Grandfather        MI   Allergies[1]    Patient Care Team: Ian Pinch L, PA-C as PCP - General (Family Medicine)   Show/hide medication list[2]  ROS        Objective:     BP 131/80   Pulse (!) 107   Ht 5' 7 (1.702 m)   Wt 166 lb (75.3 kg)   SpO2 99%   BMI 26.00 kg/m  BP Readings from Last  3 Encounters:  05/28/24 131/80  01/03/23 123/84  07/05/22 135/85   Wt Readings from Last 3 Encounters:  05/28/24 166 lb (75.3 kg)  01/03/23 172 lb (78 kg)  07/05/22 207 lb (93.9 kg)      Physical Exam  BP 131/80   Pulse (!) 107   Ht 5' 7 (1.702 m)   Wt 166 lb (75.3 kg)   SpO2 99%   BMI 26.00 kg/m     General Appearance:    Alert, cooperative, no distress, appears stated age  Head:    Normocephalic, without obvious abnormality, atraumatic  Eyes:    PERRL, conjunctiva/corneas clear, EOM's intact, fundi    benign, both eyes       Ears:    Normal TM's and external ear canals, both ears  Nose:   Nares normal, septum midline, mucosa normal, no drainage    or sinus tenderness  Throat:   Lips,  mucosa, and tongue normal; teeth and gums normal  Neck:   Supple, symmetrical, trachea midline, no adenopathy;       thyroid :  No enlargement/tenderness/nodules; no carotid   bruit or JVD  Back:     Symmetric, no curvature, ROM normal, no CVA tenderness  Lungs:     Clear to auscultation bilaterally, respirations unlabored  Chest wall:    No tenderness or deformity  Heart:    Regular rate and rhythm, S1 and S2 normal, no murmur, rub   or gallop  Abdomen:     Soft, non-tender, bowel sounds active all four quadrants,    no masses, no organomegaly        Extremities:   Extremities normal, atraumatic, no cyanosis or edema  Pulses:   2+ and symmetric all extremities  Skin:   Skin color, texture, turgor normal, no small guttate erythematous raised scaly plaques over arms and legs  Lymph nodes:   Cervical, supraclavicular, and axillary nodes normal  Neurologic:   CNII-XII intact. Normal strength, sensation and reflexes      throughout   Assessment & Plan:    Routine Health Maintenance and Physical Exam  Immunization History  Administered Date(s) Administered   Tdap 10/20/2011    Health Maintenance  Topic Date Due   COVID-19 Vaccine (1) 06/13/2024 (Originally 07/03/1986)   Influenza Vaccine  08/12/2024 (Originally 12/14/2023)   Hepatitis B Vaccines 19-59 Average Risk (1 of 3 - 19+ 3-dose series) 05/28/2025 (Originally 12/31/2004)   HIV Screening  05/28/2025 (Originally 12/31/2000)   HPV VACCINES (No Doses Required) Completed   Hepatitis C Screening  Completed   Meningococcal B Vaccine  Aged Out   DTaP/Tdap/Td  Discontinued   Pneumococcal Vaccine  Discontinued    Discussed health benefits of physical activity, and encouraged him to engage in regular exercise appropriate for his age and condition. Ian Sawyer was seen today for annual exam.  Diagnoses and all orders for this visit:  Encounter for annual physical exam -     CBC with Differential/Platelet -     CMP14+EGFR -     Lipid  panel -     TSH + free T4 -     Fe+TIBC+Fer  Mixed hyperlipidemia -     Lipid panel  Elevated hemoglobin -     CBC with Differential/Platelet -     Fe+TIBC+Fer  Daily consumption of alcohol  Tachycardia -     TSH + free T4  Acquired hypothyroidism -     TSH + free T4  Screening for diabetes mellitus -  CMP14+EGFR  Psoriasis    Assessment & Plan Acquired hypothyroidism Managed with levothyroxine  88 mcg. Symptoms improved after resuming medication. - Continue levothyroxine  88 mcg daily. - Encouraged weightlifting to help decrease heart rate.  Psoriasis Intermittent psoriasis managed with regular cleansing and exfoliation. - Continue current cleansing and exfoliation regimen.  Tachycardia Heart rate elevated at 100 bpm, possibly related to weight and activity level. Weight loss from 232 pounds to 165 pounds noted, with potential to reach 155 pounds. - Encouraged weightlifting to help decrease heart rate.  Daily alcohol consumption Continued daily alcohol consumption. Potential liver enzyme elevation discussed as a reason to consider reducing alcohol intake. - Monitor liver enzymes. - Will consider reducing alcohol intake if liver enzymes are elevated.  General Health  - Labs ordered today.  - Declines all Vaccines.      Return in about 1 year (around 05/28/2025).     Shyla Gayheart, PA-C      [1] No Known Allergies [2]  Outpatient Medications Prior to Visit  Medication Sig   [DISCONTINUED] SYNTHROID  88 MCG tablet Take 1 tablet (88 mcg total) by mouth daily before breakfast.   No facility-administered medications prior to visit.   "
